# Patient Record
Sex: Female | Born: 1970
Health system: Southern US, Community
[De-identification: ages and names within clinical notes are randomized; demographics above are authoritative.]

## PROBLEM LIST (undated history)

## (undated) DIAGNOSIS — J45909 Unspecified asthma, uncomplicated: Secondary | ICD-10-CM

## (undated) DIAGNOSIS — E119 Type 2 diabetes mellitus without complications: Secondary | ICD-10-CM

---

## 2018-05-19 DIAGNOSIS — E1165 Type 2 diabetes mellitus with hyperglycemia: Secondary | ICD-10-CM | POA: Insufficient documentation

## 2018-05-19 DIAGNOSIS — Z79899 Other long term (current) drug therapy: Secondary | ICD-10-CM | POA: Insufficient documentation

## 2018-05-19 DIAGNOSIS — Z794 Long term (current) use of insulin: Secondary | ICD-10-CM | POA: Insufficient documentation

## 2018-05-19 DIAGNOSIS — J4552 Severe persistent asthma with status asthmaticus: Principal | ICD-10-CM | POA: Insufficient documentation

## 2018-05-19 DIAGNOSIS — D72829 Elevated white blood cell count, unspecified: Secondary | ICD-10-CM | POA: Insufficient documentation

## 2018-05-20 ENCOUNTER — Observation Stay (HOSPITAL_COMMUNITY)
Admission: EM | Admit: 2018-05-20 | Discharge: 2018-05-21 | Disposition: A | Discharge status: Home / Self Care | Payer: Self-pay | Attending: Internal Medicine | Admitting: Internal Medicine

## 2018-05-20 ENCOUNTER — Encounter (HOSPITAL_COMMUNITY): Payer: Self-pay | Admitting: Emergency Medicine

## 2018-05-20 ENCOUNTER — Emergency Department (HOSPITAL_COMMUNITY): Payer: Self-pay

## 2018-05-20 ENCOUNTER — Other Ambulatory Visit: Payer: Self-pay

## 2018-05-20 DIAGNOSIS — E669 Obesity, unspecified: Secondary | ICD-10-CM | POA: Diagnosis present

## 2018-05-20 DIAGNOSIS — J4552 Severe persistent asthma with status asthmaticus: Secondary | ICD-10-CM

## 2018-05-20 DIAGNOSIS — J45901 Unspecified asthma with (acute) exacerbation: Secondary | ICD-10-CM | POA: Diagnosis present

## 2018-05-20 DIAGNOSIS — R739 Hyperglycemia, unspecified: Secondary | ICD-10-CM | POA: Diagnosis present

## 2018-05-20 LAB — I-STAT CHEM 8, ED
BUN: 11 mg/dL (ref 6–20)
CHLORIDE: 103 mmol/L (ref 98–111)
Calcium, Ion: 1.18 mmol/L (ref 1.15–1.40)
Creatinine, Ser: 0.4 mg/dL — ABNORMAL LOW (ref 0.44–1.00)
Glucose, Bld: 492 mg/dL — ABNORMAL HIGH (ref 70–99)
HEMATOCRIT: 49 % — AB (ref 36.0–46.0)
Hemoglobin: 16.7 g/dL — ABNORMAL HIGH (ref 12.0–15.0)
Potassium: 3.2 mmol/L — ABNORMAL LOW (ref 3.5–5.1)
Sodium: 137 mmol/L (ref 135–145)
TCO2: 20 mmol/L — ABNORMAL LOW (ref 22–32)

## 2018-05-20 LAB — BASIC METABOLIC PANEL
Anion gap: 21 — ABNORMAL HIGH (ref 5–15)
BUN: 10 mg/dL (ref 6–20)
CALCIUM: 9.5 mg/dL (ref 8.9–10.3)
CO2: 14 mmol/L — ABNORMAL LOW (ref 22–32)
CREATININE: 0.74 mg/dL (ref 0.44–1.00)
Chloride: 103 mmol/L (ref 98–111)
GFR calc Af Amer: >60 mL/min (ref >60)
GFR calc non Af Amer: >60 mL/min (ref >60)
Glucose, Bld: 508 mg/dL (ref 70–99)
Potassium: 3.9 mmol/L (ref 3.5–5.1)
Sodium: 138 mmol/L (ref 135–145)

## 2018-05-20 LAB — CBC
HCT: 46.2 % — ABNORMAL HIGH (ref 36.0–46.0)
Hemoglobin: 15.5 g/dL — ABNORMAL HIGH (ref 12.0–15.0)
MCH: 30.6 pg (ref 26.0–34.0)
MCHC: 33.5 g/dL (ref 30.0–36.0)
MCV: 91.1 fL (ref 78.0–100.0)
PLATELETS: 186 10*3/uL (ref 150–400)
RBC: 5.07 MIL/uL (ref 3.87–5.11)
RDW: 12.6 % (ref 11.5–15.5)
WBC: 9.5 10*3/uL (ref 4.0–10.5)

## 2018-05-20 LAB — HEMOGLOBIN A1C
Hgb A1c MFr Bld: 12.4 % — ABNORMAL HIGH (ref 4.8–5.6)
Mean Plasma Glucose: 309.18 mg/dL

## 2018-05-20 LAB — CREATININE, SERUM
CREATININE: 0.79 mg/dL (ref 0.44–1.00)
GFR calc Af Amer: >60 mL/min (ref >60)
GFR calc non Af Amer: >60 mL/min (ref >60)

## 2018-05-20 LAB — I-STAT BETA HCG BLOOD, ED (MC, WL, AP ONLY): I-stat hCG, quantitative: <5 m[IU]/mL (ref <5)

## 2018-05-20 MED ORDER — PREDNISONE 20 MG PO TABS
60.0000 mg | ORAL_TABLET | Freq: Once | ORAL | Status: AC
  Administered 2018-05-20: 60 mg via ORAL
  Filled 2018-05-20: qty 3

## 2018-05-20 MED ORDER — INSULIN ASPART 100 UNIT/ML ~~LOC~~ SOLN
0.0000 [IU] | Freq: Three times a day (TID) | SUBCUTANEOUS | Status: DC
  Administered 2018-05-20: 15 [IU] via SUBCUTANEOUS
  Administered 2018-05-21: 11 [IU] via SUBCUTANEOUS
  Administered 2018-05-21: 7 [IU] via SUBCUTANEOUS
  Administered 2018-05-21: 11 [IU] via SUBCUTANEOUS

## 2018-05-20 MED ORDER — METHYLPREDNISOLONE SODIUM SUCC 125 MG IJ SOLR
60.0000 mg | Freq: Two times a day (BID) | INTRAMUSCULAR | Status: DC
  Administered 2018-05-20 – 2018-05-21 (×3): 60 mg via INTRAVENOUS
  Filled 2018-05-20 (×3): qty 2

## 2018-05-20 MED ORDER — IPRATROPIUM-ALBUTEROL 0.5-2.5 (3) MG/3ML IN SOLN
3.0000 mL | Freq: Four times a day (QID) | RESPIRATORY_TRACT | Status: DC
  Administered 2018-05-20 – 2018-05-21 (×6): 3 mL via RESPIRATORY_TRACT
  Filled 2018-05-20 (×5): qty 3

## 2018-05-20 MED ORDER — IPRATROPIUM BROMIDE 0.02 % IN SOLN
0.5000 mg | Freq: Once | RESPIRATORY_TRACT | Status: AC
  Administered 2018-05-20: 0.5 mg via RESPIRATORY_TRACT
  Filled 2018-05-20: qty 2.5

## 2018-05-20 MED ORDER — INSULIN ASPART 100 UNIT/ML ~~LOC~~ SOLN
10.0000 [IU] | Freq: Once | SUBCUTANEOUS | Status: AC
  Administered 2018-05-20: 10 [IU] via SUBCUTANEOUS

## 2018-05-20 MED ORDER — INSULIN ASPART PROT & ASPART (70-30 MIX) 100 UNIT/ML ~~LOC~~ SUSP
10.0000 [IU] | SUBCUTANEOUS | Status: AC
  Administered 2018-05-20: 10 [IU] via SUBCUTANEOUS

## 2018-05-20 MED ORDER — ENOXAPARIN SODIUM 40 MG/0.4ML ~~LOC~~ SOLN
40.0000 mg | SUBCUTANEOUS | Status: DC
  Administered 2018-05-20 – 2018-05-21 (×2): 40 mg via SUBCUTANEOUS
  Filled 2018-05-20 (×2): qty 0.4

## 2018-05-20 MED ORDER — INSULIN ASPART 100 UNIT/ML ~~LOC~~ SOLN
25.0000 [IU] | Freq: Once | SUBCUTANEOUS | Status: AC
  Administered 2018-05-20: 25 [IU] via SUBCUTANEOUS

## 2018-05-20 MED ORDER — LORATADINE 10 MG PO TABS
10.0000 mg | ORAL_TABLET | Freq: Every day | ORAL | Status: DC
  Administered 2018-05-20 – 2018-05-21 (×2): 10 mg via ORAL
  Filled 2018-05-20 (×2): qty 1

## 2018-05-20 MED ORDER — INSULIN ASPART 100 UNIT/ML ~~LOC~~ SOLN: 0.0000 [IU] | Freq: Every day | SUBCUTANEOUS | Status: DC

## 2018-05-20 MED ORDER — ALBUTEROL SULFATE (2.5 MG/3ML) 0.083% IN NEBU
5.0000 mg | INHALATION_SOLUTION | Freq: Once | RESPIRATORY_TRACT | Status: AC
  Administered 2018-05-20: 5 mg via RESPIRATORY_TRACT
  Filled 2018-05-20: qty 6

## 2018-05-20 MED ORDER — LIVING WELL WITH DIABETES BOOK - IN SPANISH
Freq: Once | Status: DC
  Filled 2018-05-20: qty 1

## 2018-05-20 MED ORDER — IPRATROPIUM-ALBUTEROL 0.5-2.5 (3) MG/3ML IN SOLN
3.0000 mL | RESPIRATORY_TRACT | Status: DC | PRN
  Filled 2018-05-20: qty 3

## 2018-05-20 MED ORDER — ALBUTEROL (5 MG/ML) CONTINUOUS INHALATION SOLN
10.0000 mg/h | INHALATION_SOLUTION | Freq: Once | RESPIRATORY_TRACT | Status: AC
  Administered 2018-05-20: 10 mg/h via RESPIRATORY_TRACT
  Filled 2018-05-20: qty 20

## 2018-05-20 MED ORDER — INSULIN ASPART PROT & ASPART (70-30 MIX) 100 UNIT/ML ~~LOC~~ SUSP: 24.0000 [IU] | Freq: Two times a day (BID) | SUBCUTANEOUS | Status: DC

## 2018-05-20 MED ORDER — INSULIN ASPART PROT & ASPART (70-30 MIX) 100 UNIT/ML ~~LOC~~ SUSP
15.0000 [IU] | Freq: Two times a day (BID) | SUBCUTANEOUS | Status: DC
  Administered 2018-05-20: 15 [IU] via SUBCUTANEOUS
  Filled 2018-05-20: qty 10

## 2018-05-20 MED ORDER — INSULIN ASPART 100 UNIT/ML ~~LOC~~ SOLN: 0.0000 [IU] | Freq: Three times a day (TID) | SUBCUTANEOUS | Status: DC

## 2018-05-20 MED ORDER — SODIUM CHLORIDE 0.9 % IV SOLN
INTRAVENOUS | Status: AC
  Administered 2018-05-20: 16:00:00 via INTRAVENOUS

## 2018-05-20 MED ORDER — INSULIN STARTER KIT- SYRINGES (ENGLISH)
1.0000 | Freq: Once | Status: DC
  Filled 2018-05-20: qty 1

## 2018-05-20 MED ORDER — SODIUM CHLORIDE 0.9 % IV SOLN
1.0000 g | INTRAVENOUS | Status: DC
  Administered 2018-05-20 – 2018-05-21 (×2): 1 g via INTRAVENOUS
  Filled 2018-05-20 (×3): qty 10

## 2018-05-20 MED ORDER — INSULIN ASPART PROT & ASPART (70-30 MIX) 100 UNIT/ML ~~LOC~~ SUSP
30.0000 [IU] | Freq: Two times a day (BID) | SUBCUTANEOUS | Status: DC
  Administered 2018-05-20 – 2018-05-21 (×3): 30 [IU] via SUBCUTANEOUS
  Filled 2018-05-20: qty 10

## 2018-05-20 NOTE — Progress Notes (Signed)
Started a continuous nebulizer treatment to deliver 10mg /hr of albuterol.

## 2018-05-20 NOTE — ED Provider Notes (Signed)
[facility] EMERGENCY DEPARTMENT Provider Note   CSN: [id number] Arrival date & time: 05/19/18  2355     History   Chief Complaint Chief Complaint  Patient presents with  . Shortness of Breath   Level 5 caveat due to acuity of condition HPI Margaret Santana is a 47 y.o. female.  The history is provided by the patient. The history is limited by the condition of the patient.  Shortness of Breath  This is a new problem. The problem occurs frequently.The problem has been rapidly worsening. Associated symptoms include cough and wheezing. Associated medical issues include asthma.   Patient presents with cough and wheezing and shortness of breath.  Patient noted to be in distress, therefore history limited.  PMH-Asthma Past Surgical History:  Procedure Laterality Date  . CESAREAN SECTION       OB History   None      Home Medications    Prior to Admission medications   Not on File    Family History No family history on file.  Social History Social History   Tobacco Use  . Smoking status: Never Smoker  . Smokeless tobacco: Never Used  Substance Use Topics  . Alcohol use: Never    Frequency: Never  . Drug use: Never     Allergies   Patient has no allergy information on record.   Review of Systems Review of Systems  Unable to perform ROS: Acuity of condition  Respiratory: Positive for cough, shortness of breath and wheezing.      Physical Exam Updated Vital Signs BP (!) 147/83   Pulse (!) 117   Temp 98.5 F (36.9 C) (Oral)   Resp (!) 26   LMP 05/11/2018   SpO2 94%   Physical Exam CONSTITUTIONAL: Well developed/well nourished, distress noted HEAD: Normocephalic/atraumatic EYES: EOMI/PERRL ENMT: Mucous membranes moist NECK: supple no meningeal signs SPINE/BACK:entire spine nontender CV: S1/S2 noted, no murmurs/rubs/gallops noted LUNGS: Wheezing bilaterally, distress noted ABDOMEN: soft, nontender, no rebound or  guarding, bowel sounds noted throughout abdomen GU:no cva tenderness NEURO: Pt is awake/alert/appropriate, moves all extremitiesx4.  No facial droop.   EXTREMITIES: pulses normal/equal, full ROM, no lower extremity edema SKIN: warm, color normal PSYCH: no abnormalities of mood noted, alert and oriented to situation  ED Treatments / Results  Labs (all labs ordered are listed, but only abnormal results are displayed) Labs Reviewed  I-STAT CHEM 8, ED - Abnormal; Notable for the following components:      Result Value   Potassium 3.2 (*)    Creatinine, Ser 0.40 (*)    Glucose, Bld 492 (*)    TCO2 20 (*)    Hemoglobin 16.7 (*)    HCT 49.0 (*)    All other components within normal limits  I-STAT BETA HCG BLOOD, ED ([facility], [facility], [facility] ONLY)    EKG EKG Interpretation  Date/Time:  Monday May 20 2018 00:02:03 EDT Ventricular Rate:  120 PR Interval:  144 QRS Duration: 88 QT Interval:  322 QTC Calculation: 455 R Axis:   0 Text Interpretation:  Sinus tachycardia Septal infarct , age undetermined Abnormal ECG No previous ECGs available Confirmed by Zadie Rhine ([id number]) on 05/20/2018 12:56:00 AM   Radiology Dg Chest 2 View  Result Date: 05/20/2018 CLINICAL DATA:  47 year old female with shortness of breath. EXAM: CHEST - 2 VIEW COMPARISON:  None. FINDINGS: The heart size and mediastinal contours are within normal limits. Both lungs are clear. The visualized skeletal structures are unremarkable. IMPRESSION: No active  cardiopulmonary disease. Electronically Signed   By: Elgie Collard M.D.   On: 05/20/2018 00:41    Procedures Procedures  CRITICAL CARE Performed by: Joya Gaskins Total critical care time: 45 minutes Critical care time was exclusive of separately billable procedures and treating other patients. Critical care was necessary to treat or prevent imminent or life-threatening deterioration. Critical care was time spent personally by me on the following activities:  development of treatment plan with patient and/or surrogate as well as nursing, discussions with consultants, evaluation of patient's response to treatment, examination of patient, obtaining history from patient or surrogate, ordering and performing treatments and interventions, ordering and review of laboratory studies, ordering and review of radiographic studies, pulse oximetry and re-evaluation of patient's condition. Patient with status asthmaticus requiring multiple nebulized treatments and admission.  Medications Ordered in ED Medications  albuterol (PROVENTIL) (2.5 MG/3ML) 0.083% nebulizer solution 5 mg (5 mg Nebulization Given 05/20/18 0029)  predniSONE (DELTASONE) tablet 60 mg (60 mg Oral Given 05/20/18 0046)  albuterol (PROVENTIL) (2.5 MG/3ML) 0.083% nebulizer solution 5 mg (5 mg Nebulization Given 05/20/18 0039)  ipratropium (ATROVENT) nebulizer solution 0.5 mg (0.5 mg Nebulization Given 05/20/18 0039)  albuterol (PROVENTIL,VENTOLIN) solution continuous neb (10 mg/hr Nebulization Given 05/20/18 0142)  albuterol (PROVENTIL) (2.5 MG/3ML) 0.083% nebulizer solution 5 mg (5 mg Nebulization Given 05/20/18 0426)     Initial Impression / Assessment and Plan / ED Course  I have reviewed the triage vital signs and the nursing notes.  Pertinent labs & imaging results that were available during my care of the patient were reviewed by me and considered in my medical decision making (see chart for details).     4:57 AM Patient was seen soon after arrival to room for obvious asthma exacerbation.  She was tachycardic, tachypneic and mildly hypoxic.  She was aggressively treated with nebulized therapies including albuterol and Atrovent.  She was also given steroids as well. Chest x-ray was negative for pneumonia or CHF. ED revealed sinus tachycardia. despite multiple nebulized therapies, patient is still wheezing and tachypneic even in bed. I was able to arrange an interpreter to interview her after her  treatments. Interpreter [phone] Patient tells me she only has allergies.  Does not seem to know she has asthma, and labs revealed probable diabetes that appears to be untreated She is a non-smoker.  She did recently travel to [place], but denies any chest pain or fevers.  No signs of infectious etiology, low suspicion for PE at this time.  Discussed with Dr. Jomarie Longs for admission. Final Clinical Impressions(s) / ED Diagnoses   Final diagnoses:  Severe persistent asthma with status asthmaticus    ED Discharge Orders    None       Zadie Rhine, MD 05/20/18 0500

## 2018-05-20 NOTE — Progress Notes (Signed)
PROGRESS NOTE                                                                                                                                                                                                             Patient Demographics:    Margaret Santana, is a 47 y.o. female, DOB - [birth year], KJI:[id number]  Admit date - 05/20/2018   Admitting Physician Domenic Polite, MD  Outpatient Primary MD for the patient is Patient, No Pcp Per  LOS - 0   Chief Complaint  Patient presents with  . Shortness of Breath       Brief Narrative    This is a no charge note as patient admitted earlier today    Subjective:    Margaret Santana today has, No headache, No chest pain, No abdominal pain -still having some shortness of breath  Assessment  & Plan :    Active Problems:   Asthma exacerbation   Hyperglycemia   Obesity  Acute Asthma exacerbation -Still have some significant wheezing this morning, continue with IV Solu-Medrol and Rocephin, hopefully can be transitioned to p.o. prednisone in a.m.  -Chest x-ray is unremarkable -Flutter valve -Case management consult, she will need a PCP    Hyperglycemia -No history of diabetes however she's not had very good medical care previously, CBG significantly elevated at 492 on admission, hemoglobin A1c came back significantly elevated at 12.4, she was started on insulin 70/30 on admission, 15 units twice daily, I have increased it to 25 units twice daily, as well increased her sliding scale to resistant . -Diabetes coordinator consult -needs PCP    Obesity -Needs/some modification     Code Status : full code  Family Communication  : husband at bedside  Disposition Plan  : home when stable  Consults  :  none  Procedures  : None  DVT Prophylaxis  :  [place] lovenox  Lab Results  Component Value Date   PLT 186 05/20/2018    Antibiotics  :    Anti-infectives (From admission,  onward)   Start     Dose/Rate Route Frequency Ordered Stop   05/20/18 0900  cefTRIAXone (ROCEPHIN) 1 g in sodium chloride 0.9 % 100 mL IVPB     1 g 200 mL/hr over 30 Minutes Intravenous Every 24 hours 05/20/18 0801  Objective:   Vitals:   05/20/18 0638 05/20/18 0924 05/20/18 1042 05/20/18 1432  BP: 112/69 125/75    Pulse: (!) 122 (!) 117    Resp: 16 20    Temp: 98.2 F (36.8 C) 97.7 F (36.5 C)    TempSrc: Oral Oral    SpO2: 92% 93% 96% 96%    Wt Readings from Last 3 Encounters:  No data found for Wt     Intake/Output Summary (Last 24 hours) at 05/20/2018 1517 Last data filed at 05/20/2018 1125 Gross per 24 hour  Intake 120 ml  Output 650 ml  Net -530 ml     Physical Exam  Awake Alert, Oriented X 3, No new F.N deficits, Normal affect  Symmetrical Chest wall movement, diminished air entry bilaterally, with scattered wheezing RRR,No Gallops,Rubs or new Murmurs, No Parasternal Heave +ve B.Sounds, Abd Soft, No tenderness, No rebound - guarding or rigidity. No Cyanosis, Clubbing or edema, No new Rash or bruise      Data Review:    CBC Recent Labs  Lab 05/20/18 0407 05/20/18 0852  WBC  --  9.5  HGB 16.7* 15.5*  HCT 49.0* 46.2*  PLT  --  186  MCV  --  91.1  MCH  --  30.6  MCHC  --  33.5  RDW  --  12.6    Chemistries  Recent Labs  Lab 05/20/18 0407 05/20/18 0852  NA 137  --   K 3.2*  --   CL 103  --   GLUCOSE 492*  --   BUN 11  --   CREATININE 0.40* 0.79   ------------------------------------------------------------------------------------------------------------------ No results for input(s): CHOL, HDL, LDLCALC, TRIG, CHOLHDL, LDLDIRECT in the last 72 hours.  Lab Results  Component Value Date   HGBA1C 12.4 (H) 05/20/2018   ------------------------------------------------------------------------------------------------------------------ No results for input(s): TSH, T4TOTAL, T3FREE, THYROIDAB in the last 72 hours.  Invalid input(s):  FREET3 ------------------------------------------------------------------------------------------------------------------ No results for input(s): VITAMINB12, FOLATE, FERRITIN, TIBC, IRON, RETICCTPCT in the last 72 hours.  Coagulation profile No results for input(s): INR, PROTIME in the last 168 hours.  No results for input(s): DDIMER in the last 72 hours.  Cardiac Enzymes No results for input(s): CKMB, TROPONINI, MYOGLOBIN in the last 168 hours.  Invalid input(s): CK ------------------------------------------------------------------------------------------------------------------ No results found for: BNP  Inpatient Medications  Scheduled Meds: . enoxaparin (LOVENOX) injection  40 mg Subcutaneous Q24H  . insulin aspart  0-20 Units Subcutaneous TID WC  . insulin aspart  0-5 Units Subcutaneous QHS  . insulin aspart protamine- aspart  24 Units Subcutaneous BID WC  . insulin starter kit- syringes  1 kit Other Once  . ipratropium-albuterol  3 mL Nebulization Q6H  . living well with diabetes book- in spanish   Does not apply Once  . loratadine  10 mg Oral Daily  . methylPREDNISolone (SOLU-MEDROL) injection  60 mg Intravenous Q12H   Continuous Infusions: . cefTRIAXone (ROCEPHIN)  IV 1 g (05/20/18 1023)   PRN Meds:.ipratropium-albuterol  Micro Results No results found for this or any previous visit (from the past 240 hour(s)).  Radiology Reports Dg Chest 2 View  Result Date: 05/20/2018 CLINICAL DATA:  47 year old female with shortness of breath. EXAM: CHEST - 2 VIEW COMPARISON:  None. FINDINGS: The heart size and mediastinal contours are within normal limits. Both lungs are clear. The visualized skeletal structures are unremarkable. IMPRESSION: No active cardiopulmonary disease. Electronically Signed   By: Anner Crete M.D.   On: 05/20/2018 00:41     Deepak Bless  Ermagene Saidi M.D on 05/20/2018 at 3:17 PM  Between 7am to 7pm - Pager - [phone]  After 7pm go to [email or web address] -  password [facility]  [facility] -  Office  [phone]

## 2018-05-20 NOTE — H&P (Signed)
History and Physical    Margaret Santana XNA:[id number] DOB: January 04, 1971 DOA: 05/20/2018  Referring MD/NP/PA: EDP PCP:  Patient coming from: home  Chief Complaint: shortness of breath and wheezing  HPI: Margaret Santana is a 47 y.o. female from [place] with history of ? Allergies on a regimen of salmeterol, theophylline and Claritin, suspect she was most likely being treated for asthma here, presents to the emergency room with increasing shortness of breath, wheezing and cough x2days. -She denies any chest pain, swelling in her legs ED Course: noted to have diffuse wheezing with poor air movement and respiratory distress initially, improving after hour-long albuterol neb  Review of Systems: As per HPI otherwise 14 point review of systems negative.   History reviewed. No pertinent past medical history.  Past Surgical History:  Procedure Laterality Date  . CESAREAN SECTION       reports that she has never smoked. She has never used smokeless tobacco. She reports that she does not drink alcohol or use drugs.  No Known Allergies  No family history on file.   Prior to Admission medications   Medication Sig Start Date End Date Taking? Authorizing Provider  albuterol (PROVENTIL HFA;VENTOLIN HFA) 108 (90 Base) MCG/ACT inhaler Inhale 1-2 puffs into the lungs every 6 (six) hours as needed for wheezing or shortness of breath.   Yes [provider]  loratadine (CLARITIN) 10 MG tablet Take 10 mg by mouth daily.   Yes [provider]  Theophylline (THEO-24 PO) Take 1 tablet by mouth 2 (two) times daily.   Yes [provider]    Physical Exam: Vitals:   05/20/18 0334 05/20/18 0427 05/20/18 0445 05/20/18 0500  BP: 108/74  (!) 142/76 (!) 121/109  Pulse: (!) 128  (!) 128 (!) 127  Resp: 20  (!) 24 20  Temp:      TempSrc:      SpO2: 92% 91% 95% 94%      Constitutional: obese female, laying in bed, no distress Vitals:   05/20/18 0334 05/20/18 0427  05/20/18 0445 05/20/18 0500  BP: 108/74  (!) 142/76 (!) 121/109  Pulse: (!) 128  (!) 128 (!) 127  Resp: 20  (!) 24 20  Temp:      TempSrc:      SpO2: 92% 91% 95% 94%   Eyes: PERRL, lids and conjunctivae normal ENMT: Mucous membranes are moist.  Neck: normal, supple Respiratory: poor air movement bilaterally, diffuse scattered expiratory wheezes Cardiovascular: S1-S2/tachycardic Abdomen: soft, non tender, Bowel sounds positive.  Musculoskeletal: No joint deformity upper and lower extremities. Ext: no edema Skin: no rashes, lesions, ulcers.  Neurologic: CN 2-12 grossly intact. Sensation intact, DTR normal. Strength 5/5 in all 4.  Psychiatric: Normal judgment and insight. Alert and oriented x 3. Normal mood.   Labs on Admission: I have personally reviewed following labs and imaging studies  CBC: Recent Labs  Lab 05/20/18 0407  HGB 16.7*  HCT 54.2*   Basic Metabolic Panel: Recent Labs  Lab 05/20/18 0407  NA 137  K 3.2*  CL 103  GLUCOSE 492*  BUN 11  CREATININE 0.40*   GFR: CrCl cannot be calculated (Unknown ideal weight.). Liver Function Tests: No results for input(s): AST, ALT, ALKPHOS, BILITOT, PROT, ALBUMIN in the last 168 hours. No results for input(s): LIPASE, AMYLASE in the last 168 hours. No results for input(s): AMMONIA in the last 168 hours. Coagulation Profile: No results for input(s): INR, PROTIME in the last 168 hours. Cardiac Enzymes: No results  for input(s): CKTOTAL, CKMB, CKMBINDEX, TROPONINI in the last 168 hours. BNP (last 3 results) No results for input(s): PROBNP in the last 8760 hours. HbA1C: No results for input(s): HGBA1C in the last 72 hours. CBG: No results for input(s): GLUCAP in the last 168 hours. Lipid Profile: No results for input(s): CHOL, HDL, LDLCALC, TRIG, CHOLHDL, LDLDIRECT in the last 72 hours. Thyroid Function Tests: No results for input(s): TSH, T4TOTAL, FREET4, T3FREE, THYROIDAB in the last 72 hours. Anemia Panel: No  results for input(s): VITAMINB12, FOLATE, FERRITIN, TIBC, IRON, RETICCTPCT in the last 72 hours. Urine analysis: No results found for: COLORURINE, APPEARANCEUR, LABSPEC, PHURINE, GLUCOSEU, HGBUR, BILIRUBINUR, KETONESUR, PROTEINUR, UROBILINOGEN, NITRITE, LEUKOCYTESUR Sepsis Labs: '@LABRCNTIP' (procalcitonin:4,lacticidven:4) )No results found for this or any previous visit (from the past 240 hour(s)).   Radiological Exams on Admission: Dg Chest 2 View  Result Date: 05/20/2018 CLINICAL DATA:  47 year old female with shortness of breath. EXAM: CHEST - 2 VIEW COMPARISON:  None. FINDINGS: The heart size and mediastinal contours are within normal limits. Both lungs are clear. The visualized skeletal structures are unremarkable. IMPRESSION: No active cardiopulmonary disease. Electronically Signed   By: Anner Crete M.D.   On: 05/20/2018 00:41    EKG: sinus tachycardia, no acute ST-T wave changes  Assessment/Plan    Acute Asthma exacerbation -starting to improve, continue duo nebs every 6 hours, IV Solu-Medrol and add ceftriaxone -Chest x-ray is unremarkable -Flutter valve -Case management consult, she will need a PCP    Hyperglycemia -No history of diabetes however she's not had very good medical care previously, blood glucose on B met was 492, likely suggesting new onset diabetes, will confirm with hemoglobin A1c -Start insulin 70/30, SSI -Diabetes coordinator consult -needs PCP    Obesity -Needs/some modification  DVT prophylaxis: lovenox Code Status: FUll Code Family Communication: spouse at bedside Disposition Plan: home pending improvement Consults called: none Admission status: observation  Domenic Polite MD [facility] Pager [phone]  If 7PM-7AM, please contact night-coverage [email or web address] Password [facility]  05/20/2018, 5:12 AM

## 2018-05-20 NOTE — ED Triage Notes (Signed)
Pt from home has h/s of asthma. Used inhaler this afternoon. Pt denies chest pain and RR 30. Pt using accessory muscles to breathe. Inspiratory and expiratory wheezes throughout

## 2018-05-21 DIAGNOSIS — J45901 Unspecified asthma with (acute) exacerbation: Secondary | ICD-10-CM

## 2018-05-21 DIAGNOSIS — E1165 Type 2 diabetes mellitus with hyperglycemia: Secondary | ICD-10-CM

## 2018-05-21 LAB — BASIC METABOLIC PANEL
ANION GAP: 6 (ref 5–15)
BUN: 19 mg/dL (ref 6–20)
CALCIUM: 9.3 mg/dL (ref 8.9–10.3)
CO2: 24 mmol/L (ref 22–32)
Chloride: 108 mmol/L (ref 98–111)
Creatinine, Ser: 0.49 mg/dL (ref 0.44–1.00)
GLUCOSE: 189 mg/dL — AB (ref 70–99)
POTASSIUM: 4.3 mmol/L (ref 3.5–5.1)
Sodium: 138 mmol/L (ref 135–145)

## 2018-05-21 LAB — GLUCOSE, CAPILLARY
GLUCOSE-CAPILLARY: 243 mg/dL — AB (ref 70–99)
Glucose-Capillary: 266 mg/dL — ABNORMAL HIGH (ref 70–99)
Glucose-Capillary: 278 mg/dL — ABNORMAL HIGH (ref 70–99)

## 2018-05-21 LAB — CBC
HEMATOCRIT: 46.1 % — AB (ref 36.0–46.0)
Hemoglobin: 15.7 g/dL — ABNORMAL HIGH (ref 12.0–15.0)
MCH: 30.8 pg (ref 26.0–34.0)
MCHC: 34.1 g/dL (ref 30.0–36.0)
MCV: 90.6 fL (ref 78.0–100.0)
PLATELETS: 182 10*3/uL (ref 150–400)
RBC: 5.09 MIL/uL (ref 3.87–5.11)
RDW: 12.7 % (ref 11.5–15.5)
WBC: 20.7 10*3/uL — AB (ref 4.0–10.5)

## 2018-05-21 LAB — HIV ANTIBODY (ROUTINE TESTING W REFLEX): HIV Screen 4th Generation wRfx: NONREACTIVE

## 2018-05-21 MED ORDER — METFORMIN HCL 500 MG PO TABS: 500.0000 mg | ORAL_TABLET | Freq: Two times a day (BID) | ORAL | 0 refills | Status: AC

## 2018-05-21 MED ORDER — "PEN NEEDLES 3/16"" 31G X 5 MM MISC": 3 refills | Status: DC

## 2018-05-21 MED ORDER — INSULIN ISOPHANE & REGULAR (HUMAN 70-30)100 UNIT/ML KWIKPEN: 30.0000 [IU] | PEN_INJECTOR | Freq: Two times a day (BID) | SUBCUTANEOUS | 4 refills | Status: DC

## 2018-05-21 MED ORDER — PREDNISONE 10 MG PO TABS: ORAL_TABLET | ORAL | 0 refills | Status: DC

## 2018-05-21 MED ORDER — METFORMIN HCL 500 MG PO TABS
500.0000 mg | ORAL_TABLET | Freq: Two times a day (BID) | ORAL | Status: DC
  Administered 2018-05-21: 500 mg via ORAL
  Filled 2018-05-21: qty 1

## 2018-05-21 MED ORDER — FREESTYLE LANCETS MISC: 12 refills | Status: DC

## 2018-05-21 MED ORDER — METFORMIN HCL 500 MG PO TABS: 500.0000 mg | ORAL_TABLET | Freq: Two times a day (BID) | ORAL | 0 refills | Status: DC

## 2018-05-21 NOTE — Progress Notes (Signed)
Inpatient Diabetes Program Recommendations  AACE/ADA: New Consensus Statement on Inpatient Glycemic Control (2015)  Target Ranges:  Prepandial:   less than 140 mg/dL      Peak postprandial:   less than 180 mg/dL (1-2 hours)      Critically ill patients:  140 - 180 mg/dL   Lab Results  Component Value Date   GLUCAP 278 (H) 05/21/2018   HGBA1C 12.4 (H) 05/20/2018    Review of Glycemic Control Results for Margaret Santana, Margaret Santana (MRN [id number]) as of 05/21/2018 16:45  Ref. Range 05/21/2018 07:37 05/21/2018 11:44  Glucose-Capillary Latest Ref Range: 70 - 99 mg/dL 266 (H) 278 (H)   Diabetes history: new onset Outpatient Diabetes medications: none Current orders for Inpatient glycemic control: Novolog 0-20 units TID, Novolog 0-5 units QHS, Novolog 70/30 30 units BID Solumedrol 60 mg Q12H  Inpatient Diabetes Program Recommendations:    Met with patient using interpreters regarding new onset diabetes.   Reviewed patient's current A1c of 12.4%. Explained what a A1c is and what it measures. Also reviewed goal A1c with patient, importance of good glucose control @ home, and blood sugar goals. Reviewed patho of DM, need for insulin, role of pancreas, comorbidites associated from poor control and vascular changes. Dicussed differences between intermediate acting and short acting insulin and the need to eat with 70/30 insulin. Lastly, reviewed survival skills, sick days, when to contact a MD and interventions associated. Patient was able to verbalize.   Patient will need a meter. Encouraged to check BS 3-4 times per day and to write them down and take with her to her next appointment. Briefly discussed diet and the importance of eliminating sugary beverages and foods that are higher in carbohydrates. Reviewed total carbohydrates in LWWDM and reiterated carb allowances per dietitian. Educated patient on increasing activity level and the impact this has on blood sugars.   Per MD patient is scheduled to be  seen in the [facility]. Discussed plan of care and patient is able to purchase all supplies at [facility] to include: meter, lancets, strips, insulin pens, pen needles and alcohol swabs. This information was provided to patient for purchase and patient states she is able to afford. Reviewed Novolin 70/30 with pateint and confirmed dosage of 30 units BID at discharge with patient.  Educated patient and spouse on insulin pen use at home. Reviewed contents of insulin flexpen starter kit. Reviewed all steps if insulin pen including attachment of needle, 2-unit air shot, dialing up dose, giving injection, removing needle, disposal of sharps, storage of unused insulin, disposal of insulin etc. Patient able to provide successful return demonstration. Also reviewed troubleshooting with insulin pen. MD to give patient Rxs for insulin pens and insulin pen needles. At this time, patient has no further questions.   Thanks, Bronson Curb, MSN, RNC-OB Diabetes Coordinator [phone] (8a-5p)

## 2018-05-21 NOTE — Discharge Instructions (Signed)
[facility]  Get CBC, CMP, checked  by Primary MD next visit.    Activity: As tolerated with Full fall precautions use walker/cane & assistance as needed   Disposition Home    Diet: Carbohydrate modified diet   On your next visit with your primary care physician please Get Medicines reviewed and adjusted.   Please request your Prim.MD to go over all Hospital Tests and Procedure/Radiological results at the follow up, please get all Hospital records sent to your Prim MD by signing hospital release before you go home.   If you experience worsening of your admission symptoms, develop shortness of breath, life threatening emergency, suicidal or homicidal thoughts you must seek medical attention immediately by calling 911 or calling your MD immediately  if symptoms less severe.  You Must read complete instructions/literature along with all the possible adverse reactions/side effects for all the Medicines you take and that have been prescribed to you. Take any new Medicines after you have completely understood and accpet all the possible adverse reactions/side effects.   Do not drive, operating heavy machinery, perform activities at heights, swimming or participation in water activities or provide baby sitting services if your were admitted for syncope or siezures until you have seen by Primary MD or a Neurologist and advised to do so again.  Do not drive when taking Pain medications.    Do not take more than prescribed Pain, Sleep and Anxiety Medications  Special Instructions: If you have smoked or chewed Tobacco  in the last 2 yrs please stop smoking, stop any regular Alcohol  and or any Recreational drug use.  Wear Seat belts while driving.   Please note  You were cared for by a hospitalist during your hospital stay. If you have any questions about your discharge medications or the care you received while you were in the hospital after you are discharged, you  can call the unit and asked to speak with the hospitalist on call if the hospitalist that took care of you is not available. Once you are discharged, your primary care physician will handle any further medical issues. Please note that NO REFILLS for any discharge medications will be authorized once you are discharged, as it is imperative that you return to your primary care physician (or establish a relationship with a primary care physician if you do not have one) for your aftercare needs so that they can reassess your need for medications and monitor your lab values.

## 2018-05-21 NOTE — Care Management Note (Signed)
Case Management Note  Patient Details  Name: Margaret Santana MRN: [id number] Date of Birth: 1971-03-01  Subjective/Objective:    Pt admitted with SOB                Action/Plan:   Assessment facilitated via interpretor.  PTA independent from home with husband - pt denied barriers with returning home.  Pt interested in [facility] appt - appt made for 9/11 at 8:50am.  Pt informed CM that she has supply of PTA meds to get her to appt 9/11 - CM explained that pt can ask for medication assistance during first appt.     Expected Discharge Date:                  Expected Discharge Plan:  Home/Self Care  In-House Referral:     Discharge planning Services  CM Consult, [facility]  Post Acute Care Choice:    Choice offered to:     DME Arranged:    DME Agency:     HH Arranged:    HH Agency:     Status of Service:  In process, will continue to follow  If discussed at Long Length of Stay Meetings, dates discussed:    Additional Comments:  Cherylann Parr, RN 05/21/2018, 10:34 AM

## 2018-05-21 NOTE — Progress Notes (Signed)
Patient discharged to home. Patient AVS reviewed and signed. Patient capable re-verbalizing medications and follow-up appointments. IV removed. Patient belongings sent with patient. Patient educated to return to the ED in the event of SOB, chest pain or dizziness.   Ren Aspinall B. RN 

## 2018-05-21 NOTE — Plan of Care (Signed)
  RD consulted for nutrition education regarding diabetes. New onset  Lab Results  Component Value Date   HGBA1C 12.4 (H) 05/20/2018    RD provided "Carbohydrate Counting for People with Diabetes" handout from the Academy of Nutrition and Dietetics in Spanish. [employer] Interpretor services utilized. Spouse present.  Discussed different food groups and their effects on blood sugar, emphasizing carbohydrate-containing foods. Provided list of carbohydrates and recommended serving sizes of common foods.  Discussed importance of controlled and consistent carbohydrate intake throughout the day. Pt reports currently she does skip meals; discussed new goal of eating when she wakes up Pt currently drinks water and some soda. Discouraged intake of surgary drinks such as regular soda, juices, etc. Encouraged sugar-free beverages. Provided examples of ways to balance meals/snacks and encouraged intake of high-fiber, whole grain complex carbohydrates. All questions answered. Teach back method used.  Expect good compliance.  Current diet order is Carb Modified, patient reports good appetite with no weight loss. Labs and medications reviewed. No further nutrition interventions warranted at this time. RD contact information provided. If additional nutrition issues arise, please re-consult RD.  Margaret Starcher MS, RD, LDN, CNSC [phone] Pager  [phone] Weekend/On-Call Pager

## 2018-05-21 NOTE — Discharge Summary (Signed)
Margaret Santana, is a 47 y.o. female  DOB 20-Jul-1971  MRN [id number].  Admission date:  05/20/2018  Admitting Physician  Zannie Cove, MD  Discharge Date:  05/21/2018   Primary MD  Patient, No Pcp Per  Recommendations for primary care physician for things to follow:  -Check CBC, BMP during next visit -Continue adjustment of hypoglycemic agents  Admission Diagnosis  Severe persistent asthma with status asthmaticus [J45.52]   Discharge Diagnosis  Severe persistent asthma with status asthmaticus [J45.52]    Active Problems:   Asthma exacerbation   Hyperglycemia   Obesity      History reviewed. No pertinent past medical history.  Past Surgical History:  Procedure Laterality Date  . CESAREAN SECTION         History of present illness and  Hospital Course:     Kindly see H&P for history of present illness and admission details, please review complete Labs, Consult reports and Test reports for all details in brief  HPI  from the history and physical done on the day of admission  HPI: Margaret Santana is a 47 y.o. female from [place] with history of ? Allergies on a regimen of salmeterol, theophylline and Claritin, suspect she was most likely being treated for asthma here, presents to the emergency room with increasing shortness of breath, wheezing and cough x2days. -She denies any chest pain, swelling in her legs ED Course: noted to have diffuse wheezing with poor air movement and respiratory distress initially, improving after hour-long albuterol neb   Hospital Course   AcuteAsthma exacerbation -Patient reports history of asthma in the past, but never intubated, she presents with significant wheezing and increased work of breathing, she was started on IV Solu-Medrol, she had significant improvement today, giving her productive cough she was started on Rocephin as well, this  morning dyspnea has improved, wheezing has significantly improved as well, she will be discharged on prednisone taper, no indication for antibiotics on discharge, her chest x-ray was unremarkable . -Leukocytosis most likely in the setting of steroids use, he is nontoxic-appearing, afebrile  Diabetes mellitus, type II ,uncontrolled -No history of diabetes however she's not had very good medical care previously, CBG significantly elevated at 492 on admission, hemoglobin A1c came back significantly elevated at 12.4, she was started on Humulin 70/30 on admission, overall she has acceptable control with 30 units twice daily, metformin 500 mg oral twice daily, her CBG may slightly elevated, but we would tolerate that giving this is a new diagnosis, and she is currently on prednisone, and I do suspect it will improve after she stops her steroids . -She was seen by the diabetic coordinator, and the nutritionist as well .  Obesity -Nutrition consult appreciated  Follow-up appointment has been arranged with [facility]        Discharge Condition:  stable   Follow UP  Follow-up Information    [facility] Follow up.   Why:  appointment on 05/29/2018 at 8:50 am Contact  information: [address] [place] [address] [phone]            Discharge Instructions  and  Discharge Medications   Discharge Instructions    Discharge instructions   Complete by:  As directed    [facility]  Get CBC, CMP, checked  by Primary MD next visit.    Activity: As tolerated with Full fall precautions use walker/cane & assistance as needed   Disposition Home    Diet: Carbohydrate modified diet   On your next visit with your primary care physician please Get Medicines reviewed and adjusted.   Please request your Prim.MD to go over all Hospital Tests and Procedure/Radiological results at the follow  up, please get all Hospital records sent to your Prim MD by signing hospital release before you go home.   If you experience worsening of your admission symptoms, develop shortness of breath, life threatening emergency, suicidal or homicidal thoughts you must seek medical attention immediately by calling 911 or calling your MD immediately  if symptoms less severe.  You Must read complete instructions/literature along with all the possible adverse reactions/side effects for all the Medicines you take and that have been prescribed to you. Take any new Medicines after you have completely understood and accpet all the possible adverse reactions/side effects.   Do not drive, operating heavy machinery, perform activities at heights, swimming or participation in water activities or provide baby sitting services if your were admitted for syncope or siezures until you have seen by Primary MD or a Neurologist and advised to do so again.  Do not drive when taking Pain medications.    Do not take more than prescribed Pain, Sleep and Anxiety Medications  Special Instructions: If you have smoked or chewed Tobacco  in the last 2 yrs please stop smoking, stop any regular Alcohol  and or any Recreational drug use.  Wear Seat belts while driving.   Please note  You were cared for by a hospitalist during your hospital stay. If you have any questions about your discharge medications or the care you received while you were in the hospital after you are discharged, you can call the unit and asked to speak with the hospitalist on call if the hospitalist that took care of you is not available. Once you are discharged, your primary care physician will handle any further medical issues. Please note that NO REFILLS for any discharge medications will be authorized once you are discharged, as it is imperative that you return to your primary care physician (or establish a relationship with a primary care physician if you do  not have one) for your aftercare needs so that they can reassess your need for medications and monitor your lab values.   Increase activity slowly   Complete by:  As directed      Allergies as of 05/21/2018   No Known Allergies     Medication List    TAKE these medications   albuterol 108 (90 Base) MCG/ACT inhaler Commonly known as:  PROVENTIL HFA;VENTOLIN HFA Inhale 1-2 puffs into the lungs every 6 (six) hours as needed for wheezing or shortness of breath.   freestyle lancets Use as instructed   Insulin Isophane & Regular Human (70-30) 100 UNIT/ML PEN Commonly known as:  HUMULIN 70/30 MIX Inject 30 Units into the skin 2 (two) times daily before a meal.   loratadine 10 MG tablet Commonly known as:  CLARITIN Take 10 mg by mouth  daily.   metFORMIN 500 MG tablet Commonly known as:  GLUCOPHAGE Take 1 tablet (500 mg total) by mouth 2 (two) times daily with a meal.   Pen Needles 3/16" 31G X 5 MM Misc Use with insulin   predniSONE 10 MG tablet Commonly known as:  DELTASONE Take 40 mg oral for 2 days, then 30 mg oral for 2 days, then 20 mg oral for 2 days, then 10 mg oral for 2 days.   THEO-24 PO Take 1 tablet by mouth 2 (two) times daily.         Diet and Activity recommendation: See Discharge Instructions above   Consults obtained -  none  Major procedures and Radiology Reports - PLEASE review detailed and final reports for all details, in brief -      Dg Chest 2 View  Result Date: 05/20/2018 CLINICAL DATA:  47 year old female with shortness of breath. EXAM: CHEST - 2 VIEW COMPARISON:  None. FINDINGS: The heart size and mediastinal contours are within normal limits. Both lungs are clear. The visualized skeletal structures are unremarkable. IMPRESSION: No active cardiopulmonary disease. Electronically Signed   By: Elgie Collard M.D.   On: 05/20/2018 00:41    Micro Results     No results found for this or any previous visit (from the past 240  hour(s)).     Today   Subjective:   Montrice Montuori today has no headache,no chest abdominal pain,no new weakness tingling or numbness, feels much better wants to go home today.   Objective:   Blood pressure 113/79, pulse 79, temperature 98.1 F (36.7 C), temperature source Oral, resp. rate 18, last menstrual period 05/11/2018, SpO2 94 %.   Intake/Output Summary (Last 24 hours) at 05/21/2018 1711 Last data filed at 05/21/2018 0900 Gross per 24 hour  Intake 662.87 ml  Output 575 ml  Net 87.87 ml    Exam Awake Alert, Oriented x 3, No new F.N deficits, Normal affect  Symmetrical Chest wall movement, Good air movement bilaterally, scattered wheezing RRR,No Gallops,Rubs or new Murmurs, No Parasternal Heave +ve B.Sounds, Abd Soft, Non tender,  No rebound -guarding or rigidity. No Cyanosis, Clubbing or edema, No new Rash or bruise  Data Review   CBC w Diff:  Lab Results  Component Value Date   WBC 20.7 (H) 05/21/2018   HGB 15.7 (H) 05/21/2018   HCT 46.1 (H) 05/21/2018   PLT 182 05/21/2018    CMP:  Lab Results  Component Value Date   NA 138 05/21/2018   K 4.3 05/21/2018   CL 108 05/21/2018   CO2 24 05/21/2018   BUN 19 05/21/2018   CREATININE 0.49 05/21/2018  .   Total Time in preparing paper work, data evaluation and todays exam - 35 minutes  Huey Bienenstock M.D on 05/21/2018 at 5:11 PM  [facility]   Office  [phone]

## 2018-05-22 ENCOUNTER — Telehealth: Payer: Self-pay | Admitting: General Practice

## 2018-05-22 ENCOUNTER — Encounter: Payer: Self-pay | Admitting: *Deleted

## 2018-05-22 ENCOUNTER — Other Ambulatory Visit: Payer: Self-pay | Admitting: Pharmacist

## 2018-05-22 LAB — GLUCOSE, CAPILLARY
GLUCOSE-CAPILLARY: 437 mg/dL — AB (ref 70–99)
GLUCOSE-CAPILLARY: 332 mg/dL — AB (ref 70–99)
GLUCOSE-CAPILLARY: 334 mg/dL — AB (ref 70–99)
GLUCOSE-CAPILLARY: 193 mg/dL — AB (ref 70–99)
Glucose-Capillary: 486 mg/dL — ABNORMAL HIGH (ref 70–99)
Glucose-Capillary: 541 mg/dL (ref 70–99)

## 2018-05-22 MED ORDER — ALBUTEROL SULFATE HFA 108 (90 BASE) MCG/ACT IN AERS: 1.0000 | INHALATION_SPRAY | Freq: Four times a day (QID) | RESPIRATORY_TRACT | 0 refills | Status: DC | PRN

## 2018-05-22 MED FILL — ALBUTEROL SULFATE HFA 108 (: 108 (90 BAS | 25 days supply | Qty: 9 | Fill #0

## 2018-05-22 NOTE — Telephone Encounter (Signed)
Patient would like

## 2018-05-22 NOTE — Progress Notes (Signed)
Pt and husband walk-in to ED regarding D/C instructions for filling Rx.  EDCM reviewed chart to find CM advised pt to fill at [facility] pharmacy.  EDCM reviewed instructions via spanish interpreter Darrel Hoover) to take Rx to [facility] pharmacy to fill. If any problems to call [facility].

## 2018-05-25 ENCOUNTER — Emergency Department (HOSPITAL_COMMUNITY): Payer: Self-pay

## 2018-05-25 ENCOUNTER — Other Ambulatory Visit: Payer: Self-pay

## 2018-05-25 ENCOUNTER — Inpatient Hospital Stay (HOSPITAL_COMMUNITY): Payer: Self-pay

## 2018-05-25 ENCOUNTER — Inpatient Hospital Stay (HOSPITAL_COMMUNITY)
Admission: EM | Admit: 2018-05-25 | Discharge: 2018-05-28 | DRG: 202 | Disposition: A | Discharge status: Home / Self Care | Payer: Self-pay | Attending: Internal Medicine | Admitting: Internal Medicine

## 2018-05-25 ENCOUNTER — Encounter (HOSPITAL_COMMUNITY): Payer: Self-pay | Admitting: Family Medicine

## 2018-05-25 DIAGNOSIS — J9601 Acute respiratory failure with hypoxia: Secondary | ICD-10-CM | POA: Diagnosis present

## 2018-05-25 DIAGNOSIS — J45909 Unspecified asthma, uncomplicated: Secondary | ICD-10-CM | POA: Diagnosis present

## 2018-05-25 DIAGNOSIS — J4551 Severe persistent asthma with (acute) exacerbation: Principal | ICD-10-CM | POA: Diagnosis present

## 2018-05-25 DIAGNOSIS — Z794 Long term (current) use of insulin: Secondary | ICD-10-CM

## 2018-05-25 DIAGNOSIS — Z825 Family history of asthma and other chronic lower respiratory diseases: Secondary | ICD-10-CM

## 2018-05-25 DIAGNOSIS — J9602 Acute respiratory failure with hypercapnia: Secondary | ICD-10-CM | POA: Diagnosis present

## 2018-05-25 DIAGNOSIS — Z7952 Long term (current) use of systemic steroids: Secondary | ICD-10-CM

## 2018-05-25 DIAGNOSIS — J45901 Unspecified asthma with (acute) exacerbation: Secondary | ICD-10-CM

## 2018-05-25 DIAGNOSIS — Z79899 Other long term (current) drug therapy: Secondary | ICD-10-CM

## 2018-05-25 DIAGNOSIS — IMO0002 Reserved for concepts with insufficient information to code with codable children: Secondary | ICD-10-CM

## 2018-05-25 DIAGNOSIS — E1165 Type 2 diabetes mellitus with hyperglycemia: Secondary | ICD-10-CM

## 2018-05-25 DIAGNOSIS — E119 Type 2 diabetes mellitus without complications: Secondary | ICD-10-CM | POA: Diagnosis present

## 2018-05-25 LAB — BASIC METABOLIC PANEL
ANION GAP: 9 (ref 5–15)
BUN: 13 mg/dL (ref 6–20)
CALCIUM: 10 mg/dL (ref 8.9–10.3)
CO2: 29 mmol/L (ref 22–32)
Chloride: 100 mmol/L (ref 98–111)
Creatinine, Ser: 0.5 mg/dL (ref 0.44–1.00)
GFR calc non Af Amer: >60 mL/min (ref >60)
Glucose, Bld: 259 mg/dL — ABNORMAL HIGH (ref 70–99)
Potassium: 4.1 mmol/L (ref 3.5–5.1)
Sodium: 138 mmol/L (ref 135–145)

## 2018-05-25 LAB — BLOOD GAS, ARTERIAL
ACID-BASE DEFICIT: 0 mmol/L (ref 0.0–2.0)
Acid-Base Excess: 1.3 mmol/L (ref 0.0–2.0)
BICARBONATE: 23.9 mmol/L (ref 20.0–28.0)
Bicarbonate: 23.3 mmol/L (ref 20.0–28.0)
DELIVERY SYSTEMS: POSITIVE
DRAWN BY: 382351
Delivery systems: POSITIVE
Drawn by: 382351
EXPIRATORY PAP: 6
FIO2: 50
FIO2: 50
Inspiratory PAP: 14
LHR: 8 {breaths}/min
O2 SAT: 98.6 %
O2 Saturation: 94.3 %
PATIENT TEMPERATURE: 37
PCO2 ART: 46.3 mmHg (ref 32.0–48.0)
PH ART: 7.26 — AB (ref 7.350–7.450)
PO2 ART: 136 mmHg — AB (ref 83.0–108.0)
Patient temperature: 37
RATE: 21 resp/min
pCO2 arterial: 63.9 mmHg — ABNORMAL HIGH (ref 32.0–48.0)
pH, Arterial: 7.35 (ref 7.350–7.450)
pO2, Arterial: 86.8 mmHg (ref 83.0–108.0)

## 2018-05-25 LAB — CBC WITH DIFFERENTIAL/PLATELET
BASOS PCT: 1 %
Basophils Absolute: 0.1 10*3/uL (ref 0.0–0.1)
EOS ABS: 1.5 10*3/uL — AB (ref 0.0–0.7)
Eosinophils Relative: 12 %
HCT: 53.8 % — ABNORMAL HIGH (ref 36.0–46.0)
HEMOGLOBIN: 18.9 g/dL — AB (ref 12.0–15.0)
Lymphocytes Relative: 29 %
Lymphs Abs: 3.7 10*3/uL (ref 0.7–4.0)
MCH: 32.1 pg (ref 26.0–34.0)
MCHC: 35.1 g/dL (ref 30.0–36.0)
MCV: 91.5 fL (ref 78.0–100.0)
MONO ABS: 0.7 10*3/uL (ref 0.1–1.0)
MONOS PCT: 5 %
NEUTROS PCT: 53 %
Neutro Abs: 7 10*3/uL (ref 1.7–7.7)
Platelets: 191 10*3/uL (ref 150–400)
RBC: 5.88 MIL/uL — ABNORMAL HIGH (ref 3.87–5.11)
RDW: 13.1 % (ref 11.5–15.5)
WBC: 12.9 10*3/uL — ABNORMAL HIGH (ref 4.0–10.5)

## 2018-05-25 LAB — GLUCOSE, CAPILLARY
GLUCOSE-CAPILLARY: 309 mg/dL — AB (ref 70–99)
GLUCOSE-CAPILLARY: 298 mg/dL — AB (ref 70–99)
GLUCOSE-CAPILLARY: 285 mg/dL — AB (ref 70–99)
GLUCOSE-CAPILLARY: 255 mg/dL — AB (ref 70–99)
Glucose-Capillary: 210 mg/dL — ABNORMAL HIGH (ref 70–99)

## 2018-05-25 LAB — I-STAT BETA HCG BLOOD, ED (MC, WL, AP ONLY)

## 2018-05-25 LAB — BRAIN NATRIURETIC PEPTIDE: B Natriuretic Peptide: 33 pg/mL (ref 0.0–100.0)

## 2018-05-25 LAB — CBG MONITORING, ED: Glucose-Capillary: 258 mg/dL — ABNORMAL HIGH (ref 70–99)

## 2018-05-25 LAB — TROPONIN I: Troponin I: <0.03 ng/mL (ref <0.03)

## 2018-05-25 LAB — I-STAT CG4 LACTIC ACID, ED: Lactic Acid, Venous: 1.57 mmol/L (ref 0.5–1.9)

## 2018-05-25 MED ORDER — MIDAZOLAM HCL 2 MG/2ML IJ SOLN
INTRAMUSCULAR | Status: AC
  Filled 2018-05-25: qty 2

## 2018-05-25 MED ORDER — INSULIN ASPART 100 UNIT/ML ~~LOC~~ SOLN
0.0000 [IU] | SUBCUTANEOUS | Status: DC
  Administered 2018-05-25: 8 [IU] via SUBCUTANEOUS
  Administered 2018-05-25: 11 [IU] via SUBCUTANEOUS
  Administered 2018-05-25 (×2): 8 [IU] via SUBCUTANEOUS

## 2018-05-25 MED ORDER — METHYLPREDNISOLONE SODIUM SUCC 125 MG IJ SOLR
125.0000 mg | Freq: Once | INTRAMUSCULAR | Status: AC
  Administered 2018-05-25: 125 mg via INTRAVENOUS

## 2018-05-25 MED ORDER — LIVING WELL WITH DIABETES BOOK - IN SPANISH
Freq: Once | Status: DC
  Filled 2018-05-25: qty 1

## 2018-05-25 MED ORDER — IPRATROPIUM-ALBUTEROL 0.5-2.5 (3) MG/3ML IN SOLN
RESPIRATORY_TRACT | Status: AC
  Administered 2018-05-25: 3 mL
  Filled 2018-05-25: qty 3

## 2018-05-25 MED ORDER — LORAZEPAM 2 MG/ML IJ SOLN: 0.5000 mg | INTRAMUSCULAR | Status: DC | PRN

## 2018-05-25 MED ORDER — SODIUM CHLORIDE 0.9 % IV SOLN
INTRAVENOUS | Status: AC
  Administered 2018-05-25: 04:00:00 via INTRAVENOUS

## 2018-05-25 MED ORDER — ONDANSETRON HCL 4 MG/2ML IJ SOLN: 4.0000 mg | Freq: Four times a day (QID) | INTRAMUSCULAR | Status: DC | PRN

## 2018-05-25 MED ORDER — INSULIN ASPART 100 UNIT/ML ~~LOC~~ SOLN
0.0000 [IU] | Freq: Three times a day (TID) | SUBCUTANEOUS | Status: DC
  Administered 2018-05-26: 5 [IU] via SUBCUTANEOUS
  Administered 2018-05-26: 15 [IU] via SUBCUTANEOUS
  Administered 2018-05-26: 8 [IU] via SUBCUTANEOUS
  Administered 2018-05-27: 5 [IU] via SUBCUTANEOUS
  Administered 2018-05-27 (×2): 8 [IU] via SUBCUTANEOUS
  Administered 2018-05-28: 3 [IU] via SUBCUTANEOUS
  Administered 2018-05-28: 8 [IU] via SUBCUTANEOUS

## 2018-05-25 MED ORDER — IPRATROPIUM-ALBUTEROL 0.5-2.5 (3) MG/3ML IN SOLN
3.0000 mL | RESPIRATORY_TRACT | Status: DC
  Administered 2018-05-25 – 2018-05-27 (×12): 3 mL via RESPIRATORY_TRACT
  Filled 2018-05-25 (×12): qty 3

## 2018-05-25 MED ORDER — ACETAMINOPHEN 650 MG RE SUPP: 650.0000 mg | Freq: Four times a day (QID) | RECTAL | Status: DC | PRN

## 2018-05-25 MED ORDER — ORAL CARE MOUTH RINSE
15.0000 mL | Freq: Two times a day (BID) | OROMUCOSAL | Status: DC
  Administered 2018-05-25 – 2018-05-27 (×4): 15 mL via OROMUCOSAL

## 2018-05-25 MED ORDER — ENOXAPARIN SODIUM 40 MG/0.4ML ~~LOC~~ SOLN
40.0000 mg | SUBCUTANEOUS | Status: DC
  Administered 2018-05-25 – 2018-05-28 (×4): 40 mg via SUBCUTANEOUS
  Filled 2018-05-25 (×4): qty 0.4

## 2018-05-25 MED ORDER — LACTATED RINGERS IV BOLUS
2000.0000 mL | Freq: Once | INTRAVENOUS | Status: AC
  Administered 2018-05-25: 2000 mL via INTRAVENOUS

## 2018-05-25 MED ORDER — INSULIN ASPART PROT & ASPART (70-30 MIX) 100 UNIT/ML ~~LOC~~ SUSP
30.0000 [IU] | Freq: Two times a day (BID) | SUBCUTANEOUS | Status: DC
  Administered 2018-05-25 – 2018-05-26 (×2): 30 [IU] via SUBCUTANEOUS
  Filled 2018-05-25: qty 10

## 2018-05-25 MED ORDER — INSULIN DETEMIR 100 UNIT/ML ~~LOC~~ SOLN
17.0000 [IU] | Freq: Two times a day (BID) | SUBCUTANEOUS | Status: DC
  Filled 2018-05-25 (×5): qty 0.17

## 2018-05-25 MED ORDER — ALBUTEROL (5 MG/ML) CONTINUOUS INHALATION SOLN
INHALATION_SOLUTION | RESPIRATORY_TRACT | Status: AC
  Administered 2018-05-25: 10 mg
  Filled 2018-05-25: qty 20

## 2018-05-25 MED ORDER — ALBUTEROL SULFATE (2.5 MG/3ML) 0.083% IN NEBU
INHALATION_SOLUTION | RESPIRATORY_TRACT | Status: AC
  Administered 2018-05-25: 2.5 mg
  Filled 2018-05-25: qty 3

## 2018-05-25 MED ORDER — LORATADINE 10 MG PO TABS
10.0000 mg | ORAL_TABLET | Freq: Every day | ORAL | Status: DC
  Administered 2018-05-25 – 2018-05-28 (×4): 10 mg via ORAL
  Filled 2018-05-25 (×4): qty 1

## 2018-05-25 MED ORDER — IPRATROPIUM-ALBUTEROL 0.5-2.5 (3) MG/3ML IN SOLN
3.0000 mL | Freq: Four times a day (QID) | RESPIRATORY_TRACT | Status: DC
  Administered 2018-05-25 (×3): 3 mL via RESPIRATORY_TRACT
  Filled 2018-05-25 (×3): qty 3

## 2018-05-25 MED ORDER — BUDESONIDE 0.5 MG/2ML IN SUSP
0.5000 mg | Freq: Two times a day (BID) | RESPIRATORY_TRACT | Status: DC
  Administered 2018-05-26 – 2018-05-28 (×5): 0.5 mg via RESPIRATORY_TRACT
  Filled 2018-05-25 (×5): qty 2

## 2018-05-25 MED ORDER — MAGNESIUM SULFATE 2 GM/50ML IV SOLN
2.0000 g | Freq: Once | INTRAVENOUS | Status: AC
  Administered 2018-05-25: 2 g via INTRAVENOUS

## 2018-05-25 MED ORDER — MAGNESIUM SULFATE 2 GM/50ML IV SOLN
INTRAVENOUS | Status: AC
  Administered 2018-05-25: 2 g via INTRAVENOUS
  Filled 2018-05-25: qty 50

## 2018-05-25 MED ORDER — INSULIN ASPART 100 UNIT/ML ~~LOC~~ SOLN
0.0000 [IU] | Freq: Every day | SUBCUTANEOUS | Status: DC
  Administered 2018-05-25: 2 [IU] via SUBCUTANEOUS
  Administered 2018-05-26: 4 [IU] via SUBCUTANEOUS

## 2018-05-25 MED ORDER — METHYLPREDNISOLONE SODIUM SUCC 125 MG IJ SOLR
INTRAMUSCULAR | Status: AC
  Administered 2018-05-25: 125 mg via INTRAVENOUS
  Filled 2018-05-25: qty 2

## 2018-05-25 MED ORDER — IPRATROPIUM-ALBUTEROL 0.5-2.5 (3) MG/3ML IN SOLN
3.0000 mL | Freq: Once | RESPIRATORY_TRACT | Status: AC
  Administered 2018-05-25: 3 mL via RESPIRATORY_TRACT
  Filled 2018-05-25: qty 3

## 2018-05-25 MED ORDER — BUDESONIDE 0.5 MG/2ML IN SUSP
RESPIRATORY_TRACT | Status: AC
  Administered 2018-05-25: 0.5 mg
  Filled 2018-05-25: qty 2

## 2018-05-25 MED ORDER — SODIUM CHLORIDE 0.9% FLUSH
3.0000 mL | Freq: Two times a day (BID) | INTRAVENOUS | Status: DC
  Administered 2018-05-25 – 2018-05-27 (×5): 3 mL via INTRAVENOUS

## 2018-05-25 MED ORDER — THEOPHYLLINE ER 200 MG PO CP24: 200.0000 mg | ORAL_CAPSULE | Freq: Two times a day (BID) | ORAL | Status: DC

## 2018-05-25 MED ORDER — ONDANSETRON HCL 4 MG PO TABS: 4.0000 mg | ORAL_TABLET | Freq: Four times a day (QID) | ORAL | Status: DC | PRN

## 2018-05-25 MED ORDER — METHYLPREDNISOLONE SODIUM SUCC 125 MG IJ SOLR
60.0000 mg | Freq: Four times a day (QID) | INTRAMUSCULAR | Status: DC
  Administered 2018-05-25 – 2018-05-28 (×12): 60 mg via INTRAVENOUS
  Filled 2018-05-25 (×13): qty 2

## 2018-05-25 MED ORDER — SODIUM CHLORIDE 0.9 % IV SOLN: Freq: Once | INTRAVENOUS | Status: DC

## 2018-05-25 MED ORDER — SODIUM CHLORIDE 0.9% FLUSH
3.0000 mL | Freq: Two times a day (BID) | INTRAVENOUS | Status: DC
  Administered 2018-05-25 – 2018-05-28 (×6): 3 mL via INTRAVENOUS

## 2018-05-25 MED ORDER — MORPHINE SULFATE (PF) 2 MG/ML IV SOLN: 1.0000 mg | INTRAVENOUS | Status: DC | PRN

## 2018-05-25 MED ORDER — CHLORHEXIDINE GLUCONATE 0.12 % MT SOLN
15.0000 mL | Freq: Two times a day (BID) | OROMUCOSAL | Status: DC
  Administered 2018-05-25: 15 mL via OROMUCOSAL
  Filled 2018-05-25: qty 15

## 2018-05-25 MED ORDER — ALBUTEROL SULFATE (2.5 MG/3ML) 0.083% IN NEBU
2.5000 mg | INHALATION_SOLUTION | RESPIRATORY_TRACT | Status: DC | PRN
  Administered 2018-05-25: 2.5 mg via RESPIRATORY_TRACT

## 2018-05-25 MED ORDER — SENNOSIDES-DOCUSATE SODIUM 8.6-50 MG PO TABS: 1.0000 | ORAL_TABLET | Freq: Every evening | ORAL | Status: DC | PRN

## 2018-05-25 MED ORDER — IPRATROPIUM BROMIDE 0.02 % IN SOLN
RESPIRATORY_TRACT | Status: AC
  Administered 2018-05-25: 0.5 mg
  Filled 2018-05-25: qty 2.5

## 2018-05-25 MED ORDER — ACETAMINOPHEN 325 MG PO TABS: 650.0000 mg | ORAL_TABLET | Freq: Four times a day (QID) | ORAL | Status: DC | PRN

## 2018-05-25 MED ORDER — MIDAZOLAM HCL 2 MG/2ML IJ SOLN
2.0000 mg | Freq: Once | INTRAMUSCULAR | Status: AC
  Administered 2018-05-25: 2 mg via INTRAVENOUS

## 2018-05-25 MED ORDER — ALBUTEROL SULFATE (2.5 MG/3ML) 0.083% IN NEBU
INHALATION_SOLUTION | RESPIRATORY_TRACT | Status: AC
  Filled 2018-05-25: qty 3

## 2018-05-25 NOTE — Progress Notes (Signed)
Patient wheezing has increased with decreased breath sounds. Nebs have been increased to q4, Inhaled steroid has been added Pulmicort  0.5mg  neb bid

## 2018-05-25 NOTE — ED Triage Notes (Signed)
Pt arrived POV with shortness of breath due to history of asthma.

## 2018-05-25 NOTE — Progress Notes (Signed)
Patient seen and examined, database reviewed.  No family members at bedside.  Patient was admitted early this morning due to acute on chronic hypercarbic respiratory failure due to asthma exacerbation.  She has been intubated for her asthma in the past.  She was just discharged from [facility] 5 days ago for treatment of asthma exacerbation, at that time did not require mechanical ventilation.  She states she was unable to fill out her prescriptions and got progressively worse until she decided to seek medical attention yesterday.  She was found to have significant respiratory distress, ABG showed hypercarbia with a PCO2 of 69 and a pH of 7.26, was started on BiPAP.  She is significantly improved this morning, has been weaned off BiPAP, repeat ABG with pH of 7.35 and a PCO2 of 46.  She states she feels much better, still has significant wheezing on exam, continue steroids, nebs, will watch off BiPAP for now.  Repeat chest x-ray and ABG in a.m.  In regards to her diabetes, as she has constraining issues with being able to afford her medications, will transition her back to 7030 so we can adequately assess her needs while hospitalized.  We will continue to follow.  Peggye Pitt, MD [facility] Pager: [phone]

## 2018-05-25 NOTE — Progress Notes (Signed)
Patient successfully transported to ICU on BIPAP. No adverse events noted. Patient handed off to ICU RRT.

## 2018-05-25 NOTE — ED Provider Notes (Signed)
[facility] EMERGENCY DEPARTMENT Provider Note   CSN: [id number] Arrival date & time: 05/25/18  0031     History   Chief Complaint Chief Complaint  Patient presents with  . Shortness of Breath    HPI Margaret Santana is a 47 y.o. female.  HPI  Level 5 caveat for respiratory distress.  Patient comes in with chief complaint of shortness of breath. Translation services utilized.   Patient reports that she has been getting short of breath the last couple of days.  This morning her breathing started getting really bad and in the afternoon she started having diaphoresis.  Because of her worsening symptoms she finally came in via private conveyance to the emergency room.  Patient has had persistent cough for the last few days.  He denies any chest pain.  She has history of asthma which was diagnosed while she was still in [place].  Patient just moved to [place] about 2 weeks ago.  Her inhaler from [place] was not working and she just needed an admission to the hospital few days ago for asthma exacerbation.  At that visit she was diagnosed with diabetes as well.  Upon discharge patient has been using inhaler without significant relief.  She denies any fevers, chills.   Pt has no hx of PE, DVT and denies any exogenous hormone (testosterone / estrogen) use, surgery in the past 6 weeks, active cancer or recent immobilization.  Patient did travel from [place] and states that she might have been stationary for prolonged period of time during her transit.  Pt typically gets a couple of asthma attacks in a year.  She normally does not need to go to the hospital and has never been on BiPAP or intubated.  Patient denies smoking or substance abuse.  History reviewed. No pertinent past medical history.  Patient Active Problem List   Diagnosis Date Noted  . Insulin-requiring or dependent type II diabetes mellitus (HCC) 05/25/2018  . Acute hypercapnic respiratory failure (HCC) 05/25/2018  . Acute  asthma exacerbation 05/25/2018  . Asthma exacerbation 05/20/2018  . Hyperglycemia 05/20/2018  . Obesity 05/20/2018    Past Surgical History:  Procedure Laterality Date  . CESAREAN SECTION       OB History   None      Home Medications    Prior to Admission medications   Medication Sig Start Date End Date Taking? Authorizing Provider  albuterol (PROVENTIL HFA;VENTOLIN HFA) 108 (90 Base) MCG/ACT inhaler Inhale 1-2 puffs into the lungs every 6 (six) hours as needed for wheezing or shortness of breath. 05/22/18   Hoy Register, MD  Insulin Isophane & Regular Human (NOVOLIN 70/30 FLEXPEN RELION) (70-30) 100 UNIT/ML PEN Inject 30 Units into the skin 2 (two) times daily before a meal. 05/21/18   Elgergawy, Leana Roe, MD  Insulin Pen Needle (PEN NEEDLES 3/16") 31G X 5 MM MISC Use with insulin 05/21/18   Elgergawy, Leana Roe, MD  Lancets (FREESTYLE) lancets Use as instructed 05/21/18   Elgergawy, Leana Roe, MD  loratadine (CLARITIN) 10 MG tablet Take 10 mg by mouth daily.    [provider]  metFORMIN (GLUCOPHAGE) 500 MG tablet Take 1 tablet (500 mg total) by mouth 2 (two) times daily with a meal. 05/21/18   Elgergawy, Leana Roe, MD  predniSONE (DELTASONE) 10 MG tablet Take 40 mg oral for 2 days, then 30 mg oral for 2 days, then 20 mg oral for 2 days, then 10 mg oral for 2 days. 05/21/18   Elgergawy,  Leana Roe, MD  Theophylline (THEO-24 PO) Take 1 tablet by mouth 2 (two) times daily.    [provider]    Family History Family History  Problem Relation Age of Onset  . Asthma Mother     Social History Social History   Tobacco Use  . Smoking status: Never Smoker  . Smokeless tobacco: Never Used  Substance Use Topics  . Alcohol use: Never    Frequency: Never  . Drug use: Never     Allergies   Patient has no known allergies.   Review of Systems Review of Systems  Unable to perform ROS: Severe respiratory distress     Physical Exam Updated Vital Signs BP 131/81    Pulse (!) 104   Resp (!) 24   Ht 5\' 3"  (1.6 m)   Wt 73 kg   LMP 05/11/2018   SpO2 98%   BMI 28.51 kg/m   Physical Exam  Constitutional: She is oriented to person, place, and time. She appears well-developed. She appears distressed.  Diaphoretic and in respiratory distress  HENT:  Head: Atraumatic.  Eyes: EOM are normal.  Neck: Neck supple.  Cardiovascular: Regular rhythm.  Tachycardic  Pulmonary/Chest: Accessory muscle usage present. No stridor. Tachypnea noted. She is in respiratory distress. She has wheezes in the right upper field, the right middle field, the right lower field, the left upper field, the left middle field and the left lower field. She has no rhonchi. She has no rales.  Abdominal: Soft.  Musculoskeletal:       Right lower leg: Normal.       Left lower leg: Normal.  Neurological: She is alert and oriented to person, place, and time.  Skin: Skin is warm and dry.  Nursing note and vitals reviewed.    ED Treatments / Results  Labs (all labs ordered are listed, but only abnormal results are displayed) Labs Reviewed  BASIC METABOLIC PANEL - Abnormal; Notable for the following components:      Result Value   Glucose, Bld 259 (*)    All other components within normal limits  CBC WITH DIFFERENTIAL/PLATELET - Abnormal; Notable for the following components:   WBC 12.9 (*)    RBC 5.88 (*)    Hemoglobin 18.9 (*)    HCT 53.8 (*)    Eosinophils Absolute 1.5 (*)    All other components within normal limits  BLOOD GAS, ARTERIAL - Abnormal; Notable for the following components:   pH, Arterial 7.260 (*)    pCO2 arterial 63.9 (*)    All other components within normal limits  BLOOD GAS, ARTERIAL - Abnormal; Notable for the following components:   pO2, Arterial 136 (*)    All other components within normal limits  CBG MONITORING, ED - Abnormal; Notable for the following components:   Glucose-Capillary 258 (*)    All other components within normal limits  BRAIN  NATRIURETIC PEPTIDE  TROPONIN I  I-STAT BETA HCG BLOOD, ED (MC, [facility], AP ONLY)  I-STAT CG4 LACTIC ACID, ED    EKG EKG Interpretation  Date/Time:  Saturday May 25 2018 00:43:47 EDT Ventricular Rate:  114 PR Interval:    QRS Duration: 78 QT Interval:  335 QTC Calculation: 462 R Axis:   0 Text Interpretation:  Sinus tachycardia Anteroseptal infarct, old No acute changes No old tracing to compare Confirmed by Derwood Kaplan ([id number]) on 05/25/2018 3:02:26 AM   Radiology Dg Chest Port 1 View  Result Date: 05/25/2018 CLINICAL DATA:  Shortness of breath  EXAM: PORTABLE CHEST 1 VIEW COMPARISON:  Chest radiograph 05/20/2018 FINDINGS: The heart size and mediastinal contours are within normal limits. Both lungs are clear. The visualized skeletal structures are unremarkable. IMPRESSION: No active disease. Electronically Signed   By: Deatra Robinson M.D.   On: 05/25/2018 01:44    Procedures Procedures (including critical care time)  CRITICAL CARE Performed by: Ginnie Marich   Total critical care time: 90 minutes  Critical care time was exclusive of separately billable procedures and treating other patients.  Critical care was necessary to treat or prevent imminent or life-threatening deterioration.  Critical care was time spent personally by me on the following activities: development of treatment plan with patient and/or surrogate as well as nursing, discussions with consultants, evaluation of patient's response to treatment, examination of patient, obtaining history from patient or surrogate, ordering and performing treatments and interventions, ordering and review of laboratory studies, ordering and review of radiographic studies, pulse oximetry and re-evaluation of patient's condition.   Medications Ordered in ED Medications  midazolam (VERSED) 2 MG/2ML injection (  Not Given 05/25/18 0142)  ipratropium-albuterol (DUONEB) 0.5-2.5 (3) MG/3ML nebulizer solution 3 mL (has no  administration in time range)  loratadine (CLARITIN) tablet 10 mg (has no administration in time range)  insulin detemir (LEVEMIR) injection 17 Units (has no administration in time range)  insulin aspart (novoLOG) injection 0-15 Units (has no administration in time range)  enoxaparin (LOVENOX) injection 40 mg (has no administration in time range)  sodium chloride flush (NS) 0.9 % injection 3 mL (has no administration in time range)  0.9 %  sodium chloride infusion ( Intravenous New Bag/Given 05/25/18 0407)  acetaminophen (TYLENOL) tablet 650 mg (has no administration in time range)    Or  acetaminophen (TYLENOL) suppository 650 mg (has no administration in time range)  senna-docusate (Senokot-S) tablet 1 tablet (has no administration in time range)  ondansetron (ZOFRAN) tablet 4 mg (has no administration in time range)    Or  ondansetron (ZOFRAN) injection 4 mg (has no administration in time range)  LORazepam (ATIVAN) injection 0.5 mg (has no administration in time range)  albuterol (PROVENTIL) (2.5 MG/3ML) 0.083% nebulizer solution 2.5 mg (has no administration in time range)  methylPREDNISolone sodium succinate (SOLU-MEDROL) 125 mg/2 mL injection 60 mg (has no administration in time range)  morphine 2 MG/ML injection 1-3 mg (has no administration in time range)  ipratropium-albuterol (DUONEB) 0.5-2.5 (3) MG/3ML nebulizer solution (3 mLs  Given 05/25/18 0043)  albuterol (PROVENTIL) (2.5 MG/3ML) 0.083% nebulizer solution (2.5 mg  Given 05/25/18 0043)  ipratropium (ATROVENT) 0.02 % nebulizer solution (0.5 mg  Given 05/25/18 0049)  albuterol (PROVENTIL, VENTOLIN) (5 MG/ML) 0.5% continuous inhalation solution (10 mg  Given 05/25/18 0049)  midazolam (VERSED) injection 2 mg (2 mg Intravenous Given 05/25/18 0120)  methylPREDNISolone sodium succinate (SOLU-MEDROL) 125 mg/2 mL injection 125 mg (125 mg Intravenous Not Given 05/25/18 0225)  magnesium sulfate IVPB 2 g 50 mL (0 g Intravenous Stopped 05/25/18 0240)    lactated ringers bolus 2,000 mL (0 mLs Intravenous Stopped 05/25/18 0349)  ipratropium-albuterol (DUONEB) 0.5-2.5 (3) MG/3ML nebulizer solution (3 mLs  Given 05/25/18 0202)  albuterol (PROVENTIL) (2.5 MG/3ML) 0.083% nebulizer solution (2.5 mg  Given 05/25/18 0202)     Initial Impression / Assessment and Plan / ED Course  I have reviewed the triage vital signs and the nursing notes.  Pertinent labs & imaging results that were available during my care of the patient were reviewed by me and considered in my  medical decision making (see chart for details).  Clinical Course as of May 25 416  Sat May 25, 2018  0145 Nurses informed me that patient was having difficulty again tolerating the BiPAP.  She had received 1 mg of Versed.  We will give another were set at this time.  Continue to closely monitor.   [AN]  0200 Patient is tolerating BiPAP. She was given Versed.  Her initial ABG shows hypercapnic respiratory failure. X-ray does not reveal any signs of infection. Respiratory rate continues to be in the upper 20s and patient still has wheezing.  We will continue with aggressive treatment and close monitoring.   [AN]  0330 I discussed the case with the pulmonary critical care.  Patient was noted to have hypercapnic respiratory failure on ABG.  It is my belief that she had severe asthma exacerbation that led to the hypercapnia. CCM team agrees with the management thus far. They recommend that if the repeat ABG looks better and patient is tolerating BiPAP then she can likely stay at any pain hospital.  If the hospitalist is uncomfortable for the patient or ABG are deteriorating -then we need to intubate the patient.  Patient continues to be tachypneic, however there is no accessory muscle use and she is not into respiratory distress. We will admit her to hospitalist service. I asked Dr. Antionette Char to let me know if he thinks patient is too sick for admission here.  Blood gas, arterial(!) [AN]  0417 In total  patient has received 20 mg of albuterol and 1.5 mg of Atrovent.  She still wheezing, but her aeration has improved   [AN]    Clinical Course User Index [AN] Derwood Kaplan, MD    Pt comes in with cc of DIB. She is found to be in acute respiratory distress with diaphoresis. Pt is wheezing and we are concerned for severe asthma exacerbation.  Additionally, differential includes acute pulmonary edema, ACS, multifocal pneumonia and PE.   Patient was just seen few days ago for asthma exacerbation.  It appears that she felt better at the time of discharge but then quickly started feeling worse again.  Besides recent long distance travel, there is no risk factors for PE.  No evidence of volume overload on her exam.  We will start her on nebulizer treatment and continue with the BiPAP.  IV Solu-Medrol and magnesium ordered.  2 L of IV fluid given because she likely has had significant insensible fluid loss.  Patient will need close monitoring.  If she continues to deteriorate then we will need to intubate her, and if she does require intubation then we will get a CT angiogram to rule out PE.  Patient will be given Versed because she is having difficulty tolerating the BiPAP.  We will also consider ketamine if needed to help with her symptoms.  Final Clinical Impressions(s) / ED Diagnoses   Final diagnoses:  Acute respiratory failure with hypoxia and hypercapnia (HCC)  Severe persistent asthma with exacerbation    ED Discharge Orders    None       Derwood Kaplan, MD 05/25/18 [phone]

## 2018-05-25 NOTE — H&P (Signed)
History and Physical    Margaret Santana AJO:[id number] DOB: Apr 09, 1971 DOA: 05/25/2018  PCP: Patient, No Pcp Per   Patient coming from: Home   Chief Complaint: SOB   HPI: Margaret Santana is a 47 y.o. female with medical history significant for asthma, never requiring intubation, and now presenting with worsening shortness of breath and wheezing.  Patient was admitted to the hospital with acute asthma exacerbation 1 week ago, improved significantly with treatment and was discharged home with prednisone taper on 05/21/2018, reports that she never quite returned to her baseline, but was able to resume her usual activities until yesterday when she began to worsen.  Since that time, she has had increasing shortness of breath and wheezing, became significantly short of breath even at rest yesterday evening, and eventually came into the ED for evaluation.  She denies any chest pain, fevers, hemoptysis, history of DVT or PE, or lower extremity tenderness.  She reports an occasional cough productive of scant clear sputum.  ED Course: Upon arrival to the ED, patient is found to be in acute respiratory distress with mild tachycardia and stable blood pressure.  EKG features sinus tachycardia with rate 114 and chest x-ray is negative for acute cardiopulmonary disease.  Chemistry panel is notable for serum glucose of 259 and CBC features a leukocytosis to 12,900 and a mild polycythemia.  Lactic acid is reassuringly normal, troponin is undetectable, and BNP is normal.  Patient was given 2 L of lactated Ringer's, 125 mg IV Solu-Medrol, 2 g IV magnesium, Versed, and was placed on BiPAP.  She reports subjective improvement with treatment in the ED, work of breathing and respiratory rate have improved, and hypercapnia has resolved.  She remains dyspneic and tachypneic at rest, however, has been continued on BiPAP, and will require admission for ongoing evaluation and management.  Review of Systems:  All  other systems reviewed and apart from HPI, are negative.  History reviewed. No pertinent past medical history.  Past Surgical History:  Procedure Laterality Date  . CESAREAN SECTION       reports that she has never smoked. She has never used smokeless tobacco. She reports that she does not drink alcohol or use drugs.  No Known Allergies  Family History  Problem Relation Age of Onset  . Asthma Mother      Prior to Admission medications   Medication Sig Start Date End Date Taking? Authorizing Provider  albuterol (PROVENTIL HFA;VENTOLIN HFA) 108 (90 Base) MCG/ACT inhaler Inhale 1-2 puffs into the lungs every 6 (six) hours as needed for wheezing or shortness of breath. 05/22/18   Hoy Register, MD  Insulin Isophane & Regular Human (NOVOLIN 70/30 FLEXPEN RELION) (70-30) 100 UNIT/ML PEN Inject 30 Units into the skin 2 (two) times daily before a meal. 05/21/18   Elgergawy, Leana Roe, MD  Insulin Pen Needle (PEN NEEDLES 3/16") 31G X 5 MM MISC Use with insulin 05/21/18   Elgergawy, Leana Roe, MD  Lancets (FREESTYLE) lancets Use as instructed 05/21/18   Elgergawy, Leana Roe, MD  loratadine (CLARITIN) 10 MG tablet Take 10 mg by mouth daily.    [provider]  metFORMIN (GLUCOPHAGE) 500 MG tablet Take 1 tablet (500 mg total) by mouth 2 (two) times daily with a meal. 05/21/18   Elgergawy, Leana Roe, MD  predniSONE (DELTASONE) 10 MG tablet Take 40 mg oral for 2 days, then 30 mg oral for 2 days, then 20 mg oral for 2 days, then 10 mg oral for 2 days.  05/21/18   Elgergawy, Leana Roe, MD  Theophylline (THEO-24 PO) Take 1 tablet by mouth 2 (two) times daily.    [provider]    Physical Exam: Vitals:   05/25/18 0203 05/25/18 0230 05/25/18 0300 05/25/18 0340  BP: (!) 172/108 (!) 148/93 133/86 131/81  Pulse: (!) 115 (!) 110 (!) 104 (!) 104  Resp: (!) 30 (!) 27 (!) 25 (!) 24  SpO2: 96% 99% 100% 98%  Weight:      Height:          Constitutional: Tachypneic, dyspneic with speech, no  pallor, no diaphoresis  Eyes: PERTLA, lids and conjunctivae normal ENMT: Mucous membranes are moist. Posterior pharynx clear of any exudate or lesions.   Neck: normal, supple, no masses, no thyromegaly Respiratory: Expiratory wheezes. Increased WOB. No pallor or cyanosis.  Cardiovascular: Rate ~110 and regular. No extremity edema. Abdomen: No distension, no tenderness, soft. Bowel sounds normal.  Musculoskeletal: no clubbing / cyanosis. No joint deformity upper and lower extremities.    Skin: no significant rashes, lesions, ulcers. Warm, dry, well-perfused. Neurologic: CN 2-12 grossly intact. Sensation intact. Strength 5/5 in all 4 limbs.  Psychiatric: Alert and oriented x 3. Pleasant, cooperative.     Labs on Admission: I have personally reviewed following labs and imaging studies  CBC: Recent Labs  Lab 05/20/18 0407 05/20/18 0852 05/21/18 0354 05/25/18 0055  WBC  --  9.5 20.7* 12.9*  NEUTROABS  --   --   --  7.0  HGB 16.7* 15.5* 15.7* 18.9*  HCT 49.0* 46.2* 46.1* 53.8*  MCV  --  91.1 90.6 91.5  PLT  --  186 182 191   Basic Metabolic Panel: Recent Labs  Lab 05/20/18 0407 05/20/18 0852 05/21/18 0354 05/25/18 0055  NA 137 138 138 138  K 3.2* 3.9 4.3 4.1  CL 103 103 108 100  CO2  --  14* 24 29  GLUCOSE 492* 508* 189* 259*  BUN 11 10 19 13   CREATININE 0.40* 0.74  0.79 0.49 0.50  CALCIUM  --  9.5 9.3 10.0   GFR: Estimated Creatinine Clearance: 82.3 mL/min (by C-G formula based on SCr of 0.5 mg/dL). Liver Function Tests: No results for input(s): AST, ALT, ALKPHOS, BILITOT, PROT, ALBUMIN in the last 168 hours. No results for input(s): LIPASE, AMYLASE in the last 168 hours. No results for input(s): AMMONIA in the last 168 hours. Coagulation Profile: No results for input(s): INR, PROTIME in the last 168 hours. Cardiac Enzymes: Recent Labs  Lab 05/25/18 0055  TROPONINI <0.03   BNP (last 3 results) No results for input(s): PROBNP in the last 8760 hours. HbA1C: No  results for input(s): HGBA1C in the last 72 hours. CBG: Recent Labs  Lab 05/20/18 2305 05/21/18 0737 05/21/18 1144 05/21/18 1735 05/25/18 0113  GLUCAP 193* 266* 278* 243* 258*   Lipid Profile: No results for input(s): CHOL, HDL, LDLCALC, TRIG, CHOLHDL, LDLDIRECT in the last 72 hours. Thyroid Function Tests: No results for input(s): TSH, T4TOTAL, FREET4, T3FREE, THYROIDAB in the last 72 hours. Anemia Panel: No results for input(s): VITAMINB12, FOLATE, FERRITIN, TIBC, IRON, RETICCTPCT in the last 72 hours. Urine analysis: No results found for: COLORURINE, APPEARANCEUR, LABSPEC, PHURINE, GLUCOSEU, HGBUR, BILIRUBINUR, KETONESUR, PROTEINUR, UROBILINOGEN, NITRITE, LEUKOCYTESUR Sepsis Labs: @LABRCNTIP (procalcitonin:4,lacticidven:4) )No results found for this or any previous visit (from the past 240 hour(s)).   Radiological Exams on Admission: Dg Chest Port 1 View  Result Date: 05/25/2018 CLINICAL DATA:  Shortness of breath EXAM: PORTABLE CHEST 1 VIEW COMPARISON:  Chest radiograph 05/20/2018 FINDINGS: The heart size and mediastinal contours are within normal limits. Both lungs are clear. The visualized skeletal structures are unremarkable. IMPRESSION: No active disease. Electronically Signed   By: Deatra Robinson M.D.   On: 05/25/2018 01:44    EKG: Independently reviewed. Sinus tachycardia (rate 114).   Assessment/Plan   1. Asthma exacerbation; acute hypercarbic respiratory failure  - Presents with SOB and wheezing, in distress on arrival   - CXR is clear; no evidence for VTE  - She has improved in ED with IV Solu-Medrol, magnesium, and BiPAP   - Continue BiPAP as needed, continue systemic steroids, give DuoNeb now, then albuterol nebs as needed    2. Insulin-dependent DM  - A1c was 12.4% earlier this month  - Managed at home with metformin and Novolin 70/30 30 units BID  - Check CBG's, start with Levemir 17 units BID and Novolog correctional, adjust as needed     DVT prophylaxis:  Lovenox Code Status: Full  Family Communication: Husband updated at bedside Consults called: ED physician discussed with PCCM  Admission status: Inpatient     Briscoe Deutscher, MD [facility] Pager [phone]  If 7PM-7AM, please contact night-coverage [email or web address] Password [email or web address]  05/25/2018, 4:13 AM

## 2018-05-25 NOTE — Progress Notes (Signed)
Patient is doing better , wheezes are less, patient appears with less wob. She most likely can get off BiPAP in a few hours.

## 2018-05-26 DIAGNOSIS — Z794 Long term (current) use of insulin: Secondary | ICD-10-CM

## 2018-05-26 DIAGNOSIS — E119 Type 2 diabetes mellitus without complications: Secondary | ICD-10-CM

## 2018-05-26 DIAGNOSIS — J9602 Acute respiratory failure with hypercapnia: Secondary | ICD-10-CM

## 2018-05-26 LAB — BLOOD GAS, ARTERIAL
Acid-Base Excess: 0.4 mmol/L (ref 0.0–2.0)
Bicarbonate: 24.7 mmol/L (ref 20.0–28.0)
DRAWN BY: 105551
FIO2: 21
O2 SAT: 91.3 %
pCO2 arterial: 39.1 mmHg (ref 32.0–48.0)
pH, Arterial: 7.413 (ref 7.350–7.450)
pO2, Arterial: 62.4 mmHg — ABNORMAL LOW (ref 83.0–108.0)

## 2018-05-26 LAB — GLUCOSE, CAPILLARY
GLUCOSE-CAPILLARY: 355 mg/dL — AB (ref 70–99)
Glucose-Capillary: 257 mg/dL — ABNORMAL HIGH (ref 70–99)
Glucose-Capillary: 244 mg/dL — ABNORMAL HIGH (ref 70–99)
Glucose-Capillary: 331 mg/dL — ABNORMAL HIGH (ref 70–99)

## 2018-05-26 LAB — BASIC METABOLIC PANEL
ANION GAP: 10 (ref 5–15)
BUN: 16 mg/dL (ref 6–20)
CALCIUM: 9.3 mg/dL (ref 8.9–10.3)
CO2: 24 mmol/L (ref 22–32)
CREATININE: 0.36 mg/dL — AB (ref 0.44–1.00)
Chloride: 102 mmol/L (ref 98–111)
GFR calc Af Amer: >60 mL/min (ref >60)
Glucose, Bld: 299 mg/dL — ABNORMAL HIGH (ref 70–99)
Potassium: 4.4 mmol/L (ref 3.5–5.1)
SODIUM: 136 mmol/L (ref 135–145)

## 2018-05-26 LAB — CBC
HCT: 45.8 % (ref 36.0–46.0)
Hemoglobin: 15.7 g/dL — ABNORMAL HIGH (ref 12.0–15.0)
MCH: 31.5 pg (ref 26.0–34.0)
MCHC: 34.3 g/dL (ref 30.0–36.0)
MCV: 92 fL (ref 78.0–100.0)
PLATELETS: 180 10*3/uL (ref 150–400)
RBC: 4.98 MIL/uL (ref 3.87–5.11)
RDW: 13.2 % (ref 11.5–15.5)
WBC: 16.9 10*3/uL — ABNORMAL HIGH (ref 4.0–10.5)

## 2018-05-26 LAB — MRSA PCR SCREENING: MRSA BY PCR: NEGATIVE

## 2018-05-26 MED ORDER — INSULIN ASPART PROT & ASPART (70-30 MIX) 100 UNIT/ML ~~LOC~~ SUSP
33.0000 [IU] | Freq: Two times a day (BID) | SUBCUTANEOUS | Status: DC
  Administered 2018-05-26 – 2018-05-27 (×2): 33 [IU] via SUBCUTANEOUS
  Filled 2018-05-26: qty 10

## 2018-05-26 NOTE — Progress Notes (Signed)
Patients appears in no distress still has decreased breath sounds with fine wheezes.Saturation running about 92 on room air while patient flat sleeping.

## 2018-05-26 NOTE — Progress Notes (Signed)
At around 1855, patient Oxygen Sat noted to be 89-91% on room air. Patient denied feeling any shortness of breath. Patient placed on oxygen via nasal cannula @ 1.5L. O2 sat now 94%.

## 2018-05-26 NOTE — Progress Notes (Signed)
PROGRESS NOTE    Margaret Santana  ZOX:[id number] DOB: [birth year] DOA: 05/25/2018 PCP: Patient, No Pcp Per     Brief Narrative:  47 year old woman with history of asthma recently discharged from the hospital 5 days prior to this hospitalization due to asthma exacerbation.  She states she was never sent home with medications.  She never quite returned to her baseline.  She returned to the hospital day of admission due to repeat shortness of breath.  She was found to have hypoxic and hypercarbic respiratory failure, was placed on BiPAP and admission was requested.   Assessment & Plan:   Principal Problem:   Asthma exacerbation Active Problems:   Insulin-requiring or dependent type II diabetes mellitus (HCC)   Acute hypercapnic respiratory failure (HCC)   Acute asthma exacerbation   Acute hypoxic and hypercapnic respiratory failure -Due to asthma with acute exacerbation. -Did require BiPAP transiently, has since been weaned with improving ABGs.  ABG this a.m. with a pH of 7.41 and a PCO2 of 39. -Continue systemic steroids, frequent nebs. -At home she is only on albuterol.  I believe she needs a step up in treatment to include inhaled steroids as well as inhaled long-acting broncho-dilators given her severe asthma that has required 2 hospitalizations in less than a week. -Stable for transfer to floor today as she has not used BiPAP in over 24 hours. -Upon discharge we will send home on Symbicort/Advair as well as as needed albuterol.  Insulin-dependent diabetes -A1c was 12.4 earlier this month.  As of 9/7 transitioned patient back to 7030 as she will be unable to afford long-acting insulin upon discharge. -CBGs remain elevated in the 200-300 range. -We will increase 7030 from 30 to 33 units twice daily daily.   DVT prophylaxis: Lovenox Code Status: Full code Family Communication: Husband at bedside updated on plan of care and all questions answered Disposition Plan: Transfer  to floor, would anticipate discharge home over next 24 to 48 hours  Consultants:   None  Procedures:   None  Antimicrobials:  Anti-infectives (From admission, onward)   None       Subjective: Lying in bed, states she is improved but still not close to her baseline, still short of breath, denies chest pain.  Objective: Vitals:   05/26/18 0600 05/26/18 0700 05/26/18 0800 05/26/18 0837  BP: 131/80 118/85 123/78   Pulse: 76 73 70   Resp: 20 18 18    Temp:   98.4 F (36.9 C)   TempSrc:   Oral   SpO2:  91% 92% 91%  Weight:      Height:        Intake/Output Summary (Last 24 hours) at 05/26/2018 1054 Last data filed at 05/26/2018 0906 Gross per 24 hour  Intake 6 ml  Output -  Net 6 ml   Filed Weights   05/25/18 0040 05/25/18 0529 05/26/18 0500  Weight: 73 kg 76.5 kg 76.1 kg    Examination:  General exam: Alert, awake, oriented x 3, morbidly obese Respiratory system: Bilateral expiratory wheezes, coarse breath sounds.  Respiratory effort normal. Cardiovascular system:RRR. No murmurs, rubs, gallops. Gastrointestinal system: Abdomen is obese, nondistended, soft and nontender. No organomegaly or masses felt. Normal bowel sounds heard. Central nervous system: Alert and oriented. No focal neurological deficits. Extremities: No C/C/E, +pedal pulses Skin: No rashes, lesions or ulcers Psychiatry: Judgement and insight appear normal. Mood & affect appropriate.     Data Reviewed: I have personally reviewed following labs and imaging studies  CBC:  Recent Labs  Lab 05/20/18 0407 05/20/18 0852 05/21/18 0354 05/25/18 0055 05/26/18 0441  WBC  --  9.5 20.7* 12.9* 16.9*  NEUTROABS  --   --   --  7.0  --   HGB 16.7* 15.5* 15.7* 18.9* 15.7*  HCT 49.0* 46.2* 46.1* 53.8* 45.8  MCV  --  91.1 90.6 91.5 92.0  PLT  --  186 182 191 180   Basic Metabolic Panel: Recent Labs  Lab 05/20/18 0407 05/20/18 0852 05/21/18 0354 05/25/18 0055 05/26/18 0441  NA 137 138 138 138 136  K  3.2* 3.9 4.3 4.1 4.4  CL 103 103 108 100 102  CO2  --  14* 24 29 24   GLUCOSE 492* 508* 189* 259* 299*  BUN 11 10 19 13 16   CREATININE 0.40* 0.74  0.79 0.49 0.50 0.36*  CALCIUM  --  9.5 9.3 10.0 9.3   GFR: Estimated Creatinine Clearance: 84 mL/min (A) (by C-G formula based on SCr of 0.36 mg/dL (L)). Liver Function Tests: No results for input(s): AST, ALT, ALKPHOS, BILITOT, PROT, ALBUMIN in the last 168 hours. No results for input(s): LIPASE, AMYLASE in the last 168 hours. No results for input(s): AMMONIA in the last 168 hours. Coagulation Profile: No results for input(s): INR, PROTIME in the last 168 hours. Cardiac Enzymes: Recent Labs  Lab 05/25/18 0055  TROPONINI <0.03   BNP (last 3 results) No results for input(s): PROBNP in the last 8760 hours. HbA1C: No results for input(s): HGBA1C in the last 72 hours. CBG: Recent Labs  Lab 05/25/18 0802 05/25/18 1209 05/25/18 1558 05/25/18 2105 05/26/18 0733  GLUCAP 298* 285* 255* 210* 355*   Lipid Profile: No results for input(s): CHOL, HDL, LDLCALC, TRIG, CHOLHDL, LDLDIRECT in the last 72 hours. Thyroid Function Tests: No results for input(s): TSH, T4TOTAL, FREET4, T3FREE, THYROIDAB in the last 72 hours. Anemia Panel: No results for input(s): VITAMINB12, FOLATE, FERRITIN, TIBC, IRON, RETICCTPCT in the last 72 hours. Urine analysis: No results found for: COLORURINE, APPEARANCEUR, LABSPEC, PHURINE, GLUCOSEU, HGBUR, BILIRUBINUR, KETONESUR, PROTEINUR, UROBILINOGEN, NITRITE, LEUKOCYTESUR Sepsis Labs: @LABRCNTIP (procalcitonin:4,lacticidven:4)  ) Recent Results (from the past 240 hour(s))  MRSA PCR Screening     Status: None   Collection Time: 05/25/18  5:26 AM  Result Value Ref Range Status   MRSA by PCR NEGATIVE NEGATIVE Final    Comment:        The GeneXpert MRSA Assay (FDA approved for NASAL specimens only), is one component of a comprehensive MRSA colonization surveillance program. It is not intended to diagnose  MRSA infection nor to guide or monitor treatment for MRSA infections. Performed at [facility], [address] [place], [place] [address]          Radiology Studies: Dg Chest [facility] 1 View  Result Date: 05/25/2018 CLINICAL DATA:  Asthma exacerbation. EXAM: PORTABLE CHEST 1 VIEW COMPARISON:  05/25/2018 FINDINGS: The heart size and mediastinal contours are within normal limits. Both lungs are clear. The visualized skeletal structures are unremarkable. IMPRESSION: No active disease. Electronically Signed   By: Myles Rosenthal M.D.   On: 05/25/2018 17:50   Dg Chest Port 1 View  Result Date: 05/25/2018 CLINICAL DATA:  Shortness of breath EXAM: PORTABLE CHEST 1 VIEW COMPARISON:  Chest radiograph 05/20/2018 FINDINGS: The heart size and mediastinal contours are within normal limits. Both lungs are clear. The visualized skeletal structures are unremarkable. IMPRESSION: No active disease. Electronically Signed   By: Deatra Robinson M.D.   On: 05/25/2018 01:44  Scheduled Meds: . budesonide (PULMICORT) nebulizer solution  0.5 mg Nebulization BID  . enoxaparin (LOVENOX) injection  40 mg Subcutaneous Q24H  . insulin aspart  0-15 Units Subcutaneous TID WC  . insulin aspart  0-5 Units Subcutaneous QHS  . insulin aspart protamine- aspart  30 Units Subcutaneous BID WC  . ipratropium-albuterol  3 mL Nebulization Q4H  . living well with diabetes book- in spanish   Does not apply Once  . loratadine  10 mg Oral Daily  . mouth rinse  15 mL Mouth Rinse q12n4p  . methylPREDNISolone (SOLU-MEDROL) injection  60 mg Intravenous Q6H  . sodium chloride flush  3 mL Intravenous Q12H  . sodium chloride flush  3 mL Intravenous Q12H   Continuous Infusions:   LOS: 1 day    Time spent: 35 minutes. Greater than 50% of this time was spent in direct contact with the patient and with patient's husband, coordinating care and discussing relevant ongoing clinical issues, including necessity of long-term maintenance  treatment for asthma to prevent recurrent hospitalizations.     Chaya Jan, MD [facility] Pager [phone]  If 7PM-7AM, please contact night-coverage [email or web address] Password [facility] 05/26/2018, 10:54 AM

## 2018-05-27 LAB — CBC
HEMATOCRIT: 43.9 % (ref 36.0–46.0)
Hemoglobin: 15.1 g/dL — ABNORMAL HIGH (ref 12.0–15.0)
MCH: 31.5 pg (ref 26.0–34.0)
MCHC: 34.4 g/dL (ref 30.0–36.0)
MCV: 91.6 fL (ref 78.0–100.0)
Platelets: 178 10*3/uL (ref 150–400)
RBC: 4.79 MIL/uL (ref 3.87–5.11)
RDW: 13.2 % (ref 11.5–15.5)
WBC: 16.7 10*3/uL — AB (ref 4.0–10.5)

## 2018-05-27 LAB — BASIC METABOLIC PANEL
Anion gap: 7 (ref 5–15)
BUN: 16 mg/dL (ref 6–20)
CO2: 26 mmol/L (ref 22–32)
Calcium: 9.1 mg/dL (ref 8.9–10.3)
Chloride: 104 mmol/L (ref 98–111)
Creatinine, Ser: 0.42 mg/dL — ABNORMAL LOW (ref 0.44–1.00)
GFR calc Af Amer: >60 mL/min (ref >60)
Glucose, Bld: 296 mg/dL — ABNORMAL HIGH (ref 70–99)
POTASSIUM: 4.2 mmol/L (ref 3.5–5.1)
SODIUM: 137 mmol/L (ref 135–145)

## 2018-05-27 LAB — GLUCOSE, CAPILLARY
GLUCOSE-CAPILLARY: 250 mg/dL — AB (ref 70–99)
Glucose-Capillary: 269 mg/dL — ABNORMAL HIGH (ref 70–99)
Glucose-Capillary: 258 mg/dL — ABNORMAL HIGH (ref 70–99)
Glucose-Capillary: 177 mg/dL — ABNORMAL HIGH (ref 70–99)

## 2018-05-27 MED ORDER — INSULIN ASPART PROT & ASPART (70-30 MIX) 100 UNIT/ML ~~LOC~~ SUSP
36.0000 [IU] | Freq: Two times a day (BID) | SUBCUTANEOUS | Status: DC
  Administered 2018-05-27 – 2018-05-28 (×2): 36 [IU] via SUBCUTANEOUS
  Filled 2018-05-27: qty 10

## 2018-05-27 MED ORDER — IPRATROPIUM-ALBUTEROL 0.5-2.5 (3) MG/3ML IN SOLN
3.0000 mL | Freq: Four times a day (QID) | RESPIRATORY_TRACT | Status: DC
  Administered 2018-05-28: 3 mL via RESPIRATORY_TRACT
  Filled 2018-05-27 (×2): qty 3

## 2018-05-27 NOTE — Progress Notes (Signed)
PROGRESS NOTE    Margaret Santana  HOO:[id number] DOB: 1971/06/28 DOA: 05/25/2018 PCP: Patient, No Pcp Per     Brief Narrative:  47 year old woman with history of asthma recently discharged from the hospital 5 days prior to this hospitalization due to asthma exacerbation.  She states she was never sent home with medications.  She never quite returned to her baseline.  She returned to the hospital day of admission due to repeat shortness of breath.  She was found to have hypoxic and hypercarbic respiratory failure, was placed on BiPAP and admission was requested.   Assessment & Plan:   Principal Problem:   Asthma exacerbation Active Problems:   Insulin-requiring or dependent type II diabetes mellitus (HCC)   Acute hypercapnic respiratory failure (HCC)   Acute asthma exacerbation   Acute hypoxic and hypercapnic respiratory failure -Due to asthma with acute exacerbation. -Did require BiPAP transiently, has since been weaned with improving ABGs.  ABG on 9/8 with a pH of 7.41 and a PCO2 of 39. -Continue systemic steroids, frequent nebs. -At home she is only on albuterol.  I believe she needs a step up in treatment to include inhaled steroids as well as inhaled long-acting broncho-dilators given her severe asthma that has required 2 hospitalizations in less than a week. -Will also need a steroid taper on DC. -Still with wheezing on lung auscultation today, altho much improved over past 24-48 hours. -Upon discharge we will send home on Symbicort/Advair as well as as needed albuterol. -Try to wean oxygen today to keep sats >90%.  Insulin-dependent diabetes -A1c was 12.4 earlier this month.  As of 9/7 transitioned patient back to 7030 as she will be unable to afford long-acting insulin upon discharge. -CBGs remain elevated in the 200-300 range. -We will increase 7030 from 33-36 units twice daily daily. -Continue to monitor CBGs.   DVT prophylaxis: Lovenox Code Status: Full  code Family Communication: Patient only Disposition Plan: Transfer to floor, would anticipate discharge home over next 24 hours  Consultants:   None  Procedures:   None  Antimicrobials:  Anti-infectives (From admission, onward)   None       Subjective: In bed, states improved. A little short-winded after performing her morning ablutions.  Objective: Vitals:   05/27/18 0517 05/27/18 0733 05/27/18 0828 05/27/18 0831  BP:      Pulse:  69    Resp:  (!) 21    Temp:  98 F (36.7 C)    TempSrc:  Oral    SpO2:  96% 98% 98%  Weight: 76.4 kg     Height:        Intake/Output Summary (Last 24 hours) at 05/27/2018 0930 Last data filed at 05/26/2018 2121 Gross per 24 hour  Intake 363 ml  Output -  Net 363 ml   Filed Weights   05/25/18 0529 05/26/18 0500 05/27/18 0517  Weight: 76.5 kg 76.1 kg 76.4 kg    Examination:  General exam: Alert, awake, oriented x 3, obese Respiratory system: Bilateral expiratory wheezing. Respiratory effort normal. Cardiovascular system:RRR. No murmurs, rubs, gallops. Gastrointestinal system: Abdomen is obese, nondistended, soft and nontender. No organomegaly or masses felt. Normal bowel sounds heard. Central nervous system: Alert and oriented. No focal neurological deficits. Extremities: No C/C/E, +pedal pulses Skin: No rashes, lesions or ulcers Psychiatry: Judgement and insight appear normal. Mood & affect appropriate.      Data Reviewed: I have personally reviewed following labs and imaging studies  CBC: Recent Labs  Lab 05/21/18 0354  05/25/18 0055 05/26/18 0441 05/27/18 0408  WBC 20.7* 12.9* 16.9* 16.7*  NEUTROABS  --  7.0  --   --   HGB 15.7* 18.9* 15.7* 15.1*  HCT 46.1* 53.8* 45.8 43.9  MCV 90.6 91.5 92.0 91.6  PLT 182 191 180 178   Basic Metabolic Panel: Recent Labs  Lab 05/21/18 0354 05/25/18 0055 05/26/18 0441 05/27/18 0408  NA 138 138 136 137  K 4.3 4.1 4.4 4.2  CL 108 100 102 104  CO2 24 29 24 26   GLUCOSE 189*  259* 299* 296*  BUN 19 13 16 16   CREATININE 0.49 0.50 0.36* 0.42*  CALCIUM 9.3 10.0 9.3 9.1   GFR: Estimated Creatinine Clearance: 84.2 mL/min (A) (by C-G formula based on SCr of 0.42 mg/dL (L)). Liver Function Tests: No results for input(s): AST, ALT, ALKPHOS, BILITOT, PROT, ALBUMIN in the last 168 hours. No results for input(s): LIPASE, AMYLASE in the last 168 hours. No results for input(s): AMMONIA in the last 168 hours. Coagulation Profile: No results for input(s): INR, PROTIME in the last 168 hours. Cardiac Enzymes: Recent Labs  Lab 05/25/18 0055  TROPONINI <0.03   BNP (last 3 results) No results for input(s): PROBNP in the last 8760 hours. HbA1C: No results for input(s): HGBA1C in the last 72 hours. CBG: Recent Labs  Lab 05/26/18 0733 05/26/18 1140 05/26/18 1630 05/26/18 2116 05/27/18 0732  GLUCAP 355* 257* 244* 331* 269*   Lipid Profile: No results for input(s): CHOL, HDL, LDLCALC, TRIG, CHOLHDL, LDLDIRECT in the last 72 hours. Thyroid Function Tests: No results for input(s): TSH, T4TOTAL, FREET4, T3FREE, THYROIDAB in the last 72 hours. Anemia Panel: No results for input(s): VITAMINB12, FOLATE, FERRITIN, TIBC, IRON, RETICCTPCT in the last 72 hours. Urine analysis: No results found for: COLORURINE, APPEARANCEUR, LABSPEC, PHURINE, GLUCOSEU, HGBUR, BILIRUBINUR, KETONESUR, PROTEINUR, UROBILINOGEN, NITRITE, LEUKOCYTESUR Sepsis Labs: @LABRCNTIP (procalcitonin:4,lacticidven:4)  ) Recent Results (from the past 240 hour(s))  MRSA PCR Screening     Status: None   Collection Time: 05/25/18  5:26 AM  Result Value Ref Range Status   MRSA by PCR NEGATIVE NEGATIVE Final    Comment:        The GeneXpert MRSA Assay (FDA approved for NASAL specimens only), is one component of a comprehensive MRSA colonization surveillance program. It is not intended to diagnose MRSA infection nor to guide or monitor treatment for MRSA infections. Performed at [facility], [address], [place], [place] [address]          Radiology Studies: Dg Chest [facility] 1 View  Result Date: 05/25/2018 CLINICAL DATA:  Asthma exacerbation. EXAM: PORTABLE CHEST 1 VIEW COMPARISON:  05/25/2018 FINDINGS: The heart size and mediastinal contours are within normal limits. Both lungs are clear. The visualized skeletal structures are unremarkable. IMPRESSION: No active disease. Electronically Signed   By: Myles Rosenthal M.D.   On: 05/25/2018 17:50        Scheduled Meds: . budesonide (PULMICORT) nebulizer solution  0.5 mg Nebulization BID  . enoxaparin (LOVENOX) injection  40 mg Subcutaneous Q24H  . insulin aspart  0-15 Units Subcutaneous TID WC  . insulin aspart  0-5 Units Subcutaneous QHS  . insulin aspart protamine- aspart  33 Units Subcutaneous BID WC  . ipratropium-albuterol  3 mL Nebulization Q4H  . living well with diabetes book- in spanish   Does not apply Once  . loratadine  10 mg Oral Daily  . mouth rinse  15 mL Mouth Rinse q12n4p  . methylPREDNISolone (SOLU-MEDROL) injection  60  mg Intravenous Q6H  . sodium chloride flush  3 mL Intravenous Q12H  . sodium chloride flush  3 mL Intravenous Q12H   Continuous Infusions:   LOS: 2 days    Time spent: 25 minutes.     Chaya Jan, MD [facility] Pager [phone]  If 7PM-7AM, please contact night-coverage [email or web address] Password [email or web address] 05/27/2018, 9:30 AM

## 2018-05-27 NOTE — Progress Notes (Signed)
Inpatient Diabetes Program Recommendations  AACE/ADA: New Consensus Statement on Inpatient Glycemic Control (2019)  Target Ranges:  Prepandial:   less than 140 mg/dL      Peak postprandial:   less than 180 mg/dL (1-2 hours)      Critically ill patients:  140 - 180 mg/dL   Results for RIVAS ISRAEL, CULL (MRN [id number]) as of 05/27/2018 10:30  Ref. Range 05/26/2018 07:33 05/26/2018 11:40 05/26/2018 16:30 05/26/2018 21:16 05/27/2018 07:32  Glucose-Capillary Latest Ref Range: 70 - 99 mg/dL 315 (H) 945 (H) 859 (H) 331 (H) 269 (H)   Review of Glycemic Control  Diabetes history: DM2 (newly dx during last hospitalization 9/2 to 05/21/18)  Outpatient Diabetes medications: 70/30 30 units BID, Metformin 500 mg BID Current orders for Inpatient glycemic control: 70/30 36 units BID, Novolog 0-15 units TID with meals, Novolog 0-5 units QHS; Solumedrol 60 mg Q6H  Inpatient Diabetes Program Recommendations:  Insulin - Basal: Noted 70/30 increased to 36 units BID today. HgbA1C: A1C 12.4% on 05/20/18. Patient newly dx with DM during last hospitalization and was discharged new to insulin (70/30). Patient was seen by Inpatient Diabetes Coordinator on 05/21/18 (see note for details) during last hospital admission.  Thanks, Orlando Penner, RN, MSN, CDE Diabetes Coordinator Inpatient Diabetes Program [phone] (Team Pager from 8am to 5pm)

## 2018-05-27 NOTE — Care Management Note (Signed)
Case Management Note  Patient Details  Name: Margaret Santana MRN: [id number] Date of Birth: [birth year]  Subjective/Objective:  Asthma exacerbation. From home, independent. Recent admission for same. Discharged with follow up with [facility] (appt is scheduled for 05/29/2018 at 8:50). Patient was instructed last admission to also obtain medications through [facility] pharmacy. Viewed CM notes in Epic, patient returned to ED, confused about medications, interpretor was used to explain to patient again to use [facility] pharmacy.    Patient still states she never received prescriptions.                 Action/Plan: DC home. Patient has f/u appt scheduled. Will provide MATCH if needed.   Expected Discharge Date:  05/27/18               Expected Discharge Plan:  Home/Self Care  In-House Referral:     Discharge planning Services  CM Consult, Indigent [facility] Clinic  Post Acute Care Choice:    Choice offered to:     DME Arranged:    DME Agency:     HH Arranged:    HH Agency:     Status of Service:  Completed, signed off  If discussed at [employer], dates discussed:    Additional Comments:  Minnah Llamas, Chrystine Oiler, RN 05/27/2018, 11:41 AM

## 2018-05-28 DIAGNOSIS — J9601 Acute respiratory failure with hypoxia: Secondary | ICD-10-CM

## 2018-05-28 LAB — GLUCOSE, CAPILLARY
GLUCOSE-CAPILLARY: 271 mg/dL — AB (ref 70–99)
Glucose-Capillary: 182 mg/dL — ABNORMAL HIGH (ref 70–99)

## 2018-05-28 MED ORDER — PREDNISONE 10 MG PO TABS: 10.0000 mg | ORAL_TABLET | Freq: Every day | ORAL | 0 refills | Status: DC

## 2018-05-28 MED ORDER — "INSULIN SYRINGE 28G X 1/2"" 1 ML MISC": 11 refills | Status: DC

## 2018-05-28 MED ORDER — INSULIN ASPART PROT & ASPART (70-30 MIX) 100 UNIT/ML ~~LOC~~ SUSP: 36.0000 [IU] | Freq: Two times a day (BID) | SUBCUTANEOUS | 11 refills | Status: DC

## 2018-05-28 MED ORDER — ALBUTEROL SULFATE HFA 108 (90 BASE) MCG/ACT IN AERS: 2.0000 | INHALATION_SPRAY | RESPIRATORY_TRACT | 5 refills | Status: DC | PRN

## 2018-05-28 MED ORDER — BUDESONIDE-FORMOTEROL FUMARATE 160-4.5 MCG/ACT IN AERO: 2.0000 | INHALATION_SPRAY | Freq: Two times a day (BID) | RESPIRATORY_TRACT | 5 refills | Status: DC

## 2018-05-28 NOTE — Discharge Summary (Addendum)
Physician Discharge Summary  Margaret Santana OZH:[id number] DOB: 16-Jan-1971 DOA: 05/25/2018  PCP: Patient, No Pcp Per  Admit date: 05/25/2018 Discharge date: 05/28/2018  Time spent: 45 minutes  Recommendations for Outpatient Follow-up:  -To be discharged home today. -We will provide prescriptions for inhalers and prednisone taper. -She has appointment scheduled with a community health and wellness clinic in [place] tomorrow 9/11.  Discharge Diagnoses:  Principal Problem:   Asthma exacerbation Active Problems:   Insulin-requiring or dependent type II diabetes mellitus (HCC)   Acute hypercapnic respiratory failure (HCC)   Acute asthma exacerbation   Discharge Condition: Stable and improved  Filed Weights   05/25/18 0529 05/26/18 0500 05/27/18 0517  Weight: 76.5 kg 76.1 kg 76.4 kg    History of present illness:  As per Dr. Antionette Char on 9/7: Margaret Santana is a 47 y.o. female with medical history significant for asthma, never requiring intubation, and now presenting with worsening shortness of breath and wheezing.  Patient was admitted to the hospital with acute asthma exacerbation 1 week ago, improved significantly with treatment and was discharged home with prednisone taper on 05/21/2018, reports that she never quite returned to her baseline, but was able to resume her usual activities until yesterday when she began to worsen.  Since that time, she has had increasing shortness of breath and wheezing, became significantly short of breath even at rest yesterday evening, and eventually came into the ED for evaluation.  She denies any chest pain, fevers, hemoptysis, history of DVT or PE, or lower extremity tenderness.  She reports an occasional cough productive of scant clear sputum.  ED Course: Upon arrival to the ED, patient is found to be in acute respiratory distress with mild tachycardia and stable blood pressure.  EKG features sinus tachycardia with rate 114 and chest  x-ray is negative for acute cardiopulmonary disease.  Chemistry panel is notable for serum glucose of 259 and CBC features a leukocytosis to 12,900 and a mild polycythemia.  Lactic acid is reassuringly normal, troponin is undetectable, and BNP is normal.  Patient was given 2 L of lactated Ringer's, 125 mg IV Solu-Medrol, 2 g IV magnesium, Versed, and was placed on BiPAP.  She reports subjective improvement with treatment in the ED, work of breathing and respiratory rate have improved, and hypercapnia has resolved.  She remains dyspneic and tachypneic at rest, however, has been continued on BiPAP, and will require admission for ongoing evaluation and management.  Hospital Course:   Acute hypoxic and hypercapnic respiratory failure -Due to asthma with acute exacerbation. -Did require BiPAP transiently, has since been weaned with improving ABGs.  ABG on 9/8 with a pH of 7.41 and a PCO2 of 39. -Continue systemic steroids, frequent nebs. -At home she is only on albuterol.  I believe she needs a step up in treatment to include inhaled steroids as well as inhaled long-acting broncho-dilators given her severe asthma that has required 2 hospitalizations in less than a week. -Will also need a steroid taper on DC. -She is doing well, has been completely weaned off oxygen with sats above 91%. -Will discharge  home today on Symbicort/Advair as well as as needed albuterol. -Has appointment scheduled with the [place] and wellness clinic for tomorrow 9/11.  Insulin-dependent diabetes -A1c was 12.4 earlier this month.  As of 9/7 transitioned patient back to 7030 as she will be unable to afford long-acting insulin upon discharge. -CBGs with significant improvement on current dose of 70 3036 units twice daily,  will discharge on this dose. -Will need continued outpatient diabetic monitoring for further insulin dose adjustment and continue diet education.  Procedures:  None    Consultations:  None  Discharge Instructions  Discharge Instructions    Diet - low sodium heart healthy   Complete by:  As directed    Increase activity slowly   Complete by:  As directed      Allergies as of 05/28/2018   No Known Allergies     Medication List    STOP taking these medications   freestyle lancets   Insulin Isophane & Regular Human (70-30) 100 UNIT/ML PEN Commonly known as:  HUMULIN 70/30 MIX   Pen Needles 3/16" 31G X 5 MM Misc   THEO-24 PO     TAKE these medications   albuterol 108 (90 Base) MCG/ACT inhaler Commonly known as:  PROVENTIL HFA;VENTOLIN HFA Inhale 1-2 puffs into the lungs every 6 (six) hours as needed for wheezing or shortness of breath. What changed:  Another medication with the same name was added. Make sure you understand how and when to take each.   albuterol 108 (90 Base) MCG/ACT inhaler Commonly known as:  PROVENTIL HFA;VENTOLIN HFA Inhale 2 puffs into the lungs every 4 (four) hours as needed for wheezing or shortness of breath. What changed:  You were already taking a medication with the same name, and this prescription was added. Make sure you understand how and when to take each.   budesonide-formoterol 160-4.5 MCG/ACT inhaler Commonly known as:  SYMBICORT Inhale 2 puffs into the lungs 2 (two) times daily.   insulin aspart protamine- aspart (70-30) 100 UNIT/ML injection Commonly known as:  NOVOLOG MIX 70/30 Inject 0.36 mLs (36 Units total) into the skin 2 (two) times daily with a meal.   INSULIN SYRINGE 1CC/28G 28G X 1/2" 1 ML Misc Use with insulin BID.   loratadine 10 MG tablet Commonly known as:  CLARITIN Take 10 mg by mouth daily.   metFORMIN 500 MG tablet Commonly known as:  GLUCOPHAGE Take 1 tablet (500 mg total) by mouth 2 (two) times daily with a meal.   predniSONE 10 MG tablet Commonly known as:  DELTASONE Take 1 tablet (10 mg total) by mouth daily with breakfast. Take 6 tablets today and then decrease by 1  tablet daily until none are left. What changed:    how much to take  how to take this  when to take this  additional instructions      No Known Allergies    The results of significant diagnostics from this hospitalization (including imaging, microbiology, ancillary and laboratory) are listed below for reference.    Significant Diagnostic Studies: Dg Chest 2 View  Result Date: 05/20/2018 CLINICAL DATA:  47 year old female with shortness of breath. EXAM: CHEST - 2 VIEW COMPARISON:  None. FINDINGS: The heart size and mediastinal contours are within normal limits. Both lungs are clear. The visualized skeletal structures are unremarkable. IMPRESSION: No active cardiopulmonary disease. Electronically Signed   By: Elgie Collard M.D.   On: 05/20/2018 00:41   Dg Chest Port 1 View  Result Date: 05/25/2018 CLINICAL DATA:  Asthma exacerbation. EXAM: PORTABLE CHEST 1 VIEW COMPARISON:  05/25/2018 FINDINGS: The heart size and mediastinal contours are within normal limits. Both lungs are clear. The visualized skeletal structures are unremarkable. IMPRESSION: No active disease. Electronically Signed   By: Myles Rosenthal M.D.   On: 05/25/2018 17:50   Dg Chest Port 1 View  Result Date: 05/25/2018 CLINICAL DATA:  Shortness of breath EXAM: PORTABLE CHEST 1 VIEW COMPARISON:  Chest radiograph 05/20/2018 FINDINGS: The heart size and mediastinal contours are within normal limits. Both lungs are clear. The visualized skeletal structures are unremarkable. IMPRESSION: No active disease. Electronically Signed   By: Deatra Robinson M.D.   On: 05/25/2018 01:44    Microbiology: Recent Results (from the past 240 hour(s))  MRSA PCR Screening     Status: None   Collection Time: 05/25/18  5:26 AM  Result Value Ref Range Status   MRSA by PCR NEGATIVE NEGATIVE Final    Comment:        The GeneXpert MRSA Assay (FDA approved for NASAL specimens only), is one component of a comprehensive MRSA colonization surveillance  program. It is not intended to diagnose MRSA infection nor to guide or monitor treatment for MRSA infections. Performed at [facility], [address] [place], [place] [address]      Labs: Basic Metabolic Panel: Recent Labs  Lab 05/25/18 0055 05/26/18 0441 05/27/18 0408  NA 138 136 137  K 4.1 4.4 4.2  CL 100 102 104  CO2 29 24 26   GLUCOSE 259* 299* 296*  BUN 13 16 16   CREATININE 0.50 0.36* 0.42*  CALCIUM 10.0 9.3 9.1   Liver Function Tests: No results for input(s): AST, ALT, ALKPHOS, BILITOT, PROT, ALBUMIN in the last 168 hours. No results for input(s): LIPASE, AMYLASE in the last 168 hours. No results for input(s): AMMONIA in the last 168 hours. CBC: Recent Labs  Lab 05/25/18 0055 05/26/18 0441 05/27/18 0408  WBC 12.9* 16.9* 16.7*  NEUTROABS 7.0  --   --   HGB 18.9* 15.7* 15.1*  HCT 53.8* 45.8 43.9  MCV 91.5 92.0 91.6  PLT 191 180 178   Cardiac Enzymes: Recent Labs  Lab 05/25/18 0055  TROPONINI <0.03   BNP: BNP (last 3 results) Recent Labs    05/25/18 0055  BNP 33.0    ProBNP (last 3 results) No results for input(s): PROBNP in the last 8760 hours.  CBG: Recent Labs  Lab 05/27/18 1142 05/27/18 1628 05/27/18 2120 05/28/18 0748 05/28/18 1124  GLUCAP 250* 258* 177* 182* 271*       Signed:  Chaya Jan  [facility] Pager: [phone] 05/28/2018, 11:44 AM

## 2018-05-28 NOTE — Progress Notes (Signed)
Discharge instructions reviewed with patient via interpreter service. Patient verbalized understanding of instructions. Patient discharged home in stable condition.

## 2018-05-28 NOTE — Care Management Note (Signed)
Case Management Note  Patient Details  Name: Margaret Santana MRN: [id number] Date of Birth: May 23, 1971  Expected Discharge Date:  05/28/18               Expected Discharge Plan:  Home/Self Care  In-House Referral:  NA  Discharge planning Services  CM Consult, Indigent Health Clinic, [facility]  Post Acute Care Choice:  NA Choice offered to:  NA  Status of Service:  Completed, signed off  If discussed at Long Length of Stay Meetings, dates discussed:    Additional Comments: DC home today with self care. F/U with [facility] scheduled for tomorrow. MATCH provided so pt may get medications today prior to appointment tomorrow.   Malcolm Metro, RN 05/28/2018, 12:48 PM

## 2018-05-29 ENCOUNTER — Inpatient Hospital Stay: Payer: Self-pay

## 2018-05-29 NOTE — Progress Notes (Deleted)
Patient ID: Margaret Santana, female   DOB: 08/24/1971, 47 y.o.   MRN: [id number]  After hospitalization 9/7-9/06/2018   From discharge summary: [name] [facility] a 47 y.o.femalewith medical history significant forasthma, never requiring intubation, and now presenting with worsening shortness of breath and wheezing. Patient was admitted to the hospital with acute asthma exacerbation 1 week ago,improved significantly with treatment and was discharged home with prednisone taper on 05/21/2018, reports that she never quite returned to her baseline, but was able to resume her usual activities until yesterday when she began to worsen. Since that time, she has had increasing shortness of breath and wheezing, became significantly short of breath even at rest yesterday evening, and eventually came into the ED for evaluation. She denies any chest pain, fevers, hemoptysis, history of DVT or PE, or lower extremity tenderness.She reports an occasional cough productive of scant clear sputum.  ED Course:Upon arrival to the ED, patient is found to be in acute respiratory distress with mild tachycardia and stable blood pressure. EKG features sinus tachycardia with rate 114 and chest x-ray is negative for acute cardiopulmonary disease. Chemistry panel is notable for serum glucose of 259 and CBC features a leukocytosis to 12,900 and a mild polycythemia. Lactic acid is reassuringly normal, troponin is undetectable, and BNP is normal. Patient was given 2 L of lactated Ringer's, 125 mg IV Solu-Medrol, 2 g IV magnesium, Versed, and was placed on BiPAP. She reports subjective improvement with treatment in the ED, work of breathing and respiratory rate have improved, and hypercapnia has resolved. She remains dyspneic and tachypneic at rest, however, has been continued on BiPAP, and will require admission for ongoing evaluation and management.  Hospital Course:   Acute hypoxic and hypercapnic  respiratory failure -Due to asthma with acute exacerbation. -Did require BiPAP transiently, has since been weaned with improving ABGs. ABG on 9/8with a pH of 7.41 and a PCO2 of 39. -Continue systemic steroids, frequent nebs. -At home she is only on albuterol. I believe she needs a step up in treatment to include inhaled steroids as well as inhaled long-acting broncho-dilators given her severe asthma that has required 2 hospitalizations in less than a week. -Will also need a steroid taper on DC. -She is doing well, has been completely weaned off oxygen with sats above 91%. -Will discharge  home today on Symbicort/Advair as well as as needed albuterol. -Has appointment scheduled with the [place] and wellness clinic for tomorrow 9/11.  Insulin-dependent diabetes -A1c was 12.4 earlier this month. As of 9/7 transitioned patient back to 7030 as she will be unable to afford long-acting insulin upon discharge. -CBGs with significant improvement on current dose of 70 3036 units twice daily, will discharge on this dose. -Will need continued outpatient diabetic monitoring for further insulin dose adjustment and continue diet education.

## 2019-01-13 ENCOUNTER — Emergency Department (HOSPITAL_COMMUNITY): Payer: Self-pay

## 2019-01-13 ENCOUNTER — Emergency Department (HOSPITAL_COMMUNITY)
Admission: EM | Admit: 2019-01-13 | Discharge: 2019-01-13 | Disposition: A | Discharge status: Home / Self Care | Payer: Self-pay | Attending: Emergency Medicine | Admitting: Emergency Medicine

## 2019-01-13 ENCOUNTER — Encounter (HOSPITAL_COMMUNITY): Payer: Self-pay

## 2019-01-13 ENCOUNTER — Other Ambulatory Visit: Payer: Self-pay

## 2019-01-13 DIAGNOSIS — E119 Type 2 diabetes mellitus without complications: Secondary | ICD-10-CM | POA: Insufficient documentation

## 2019-01-13 DIAGNOSIS — Z79899 Other long term (current) drug therapy: Secondary | ICD-10-CM | POA: Insufficient documentation

## 2019-01-13 DIAGNOSIS — J4541 Moderate persistent asthma with (acute) exacerbation: Secondary | ICD-10-CM | POA: Insufficient documentation

## 2019-01-13 DIAGNOSIS — Z794 Long term (current) use of insulin: Secondary | ICD-10-CM | POA: Insufficient documentation

## 2019-01-13 HISTORY — DX: Type 2 diabetes mellitus without complications: E11.9

## 2019-01-13 HISTORY — DX: Unspecified asthma, uncomplicated: J45.909

## 2019-01-13 LAB — BASIC METABOLIC PANEL
Anion gap: 11 (ref 5–15)
BUN: 13 mg/dL (ref 6–20)
CO2: 25 mmol/L (ref 22–32)
Calcium: 9.6 mg/dL (ref 8.9–10.3)
Chloride: 101 mmol/L (ref 98–111)
Creatinine, Ser: 0.51 mg/dL (ref 0.44–1.00)
GFR calc Af Amer: >60 mL/min (ref >60)
GFR calc non Af Amer: >60 mL/min (ref >60)
Glucose, Bld: 311 mg/dL — ABNORMAL HIGH (ref 70–99)
Potassium: 3.9 mmol/L (ref 3.5–5.1)
Sodium: 137 mmol/L (ref 135–145)

## 2019-01-13 LAB — CBC WITH DIFFERENTIAL/PLATELET
Abs Immature Granulocytes: 0.03 10*3/uL (ref 0.00–0.07)
Basophils Absolute: 0.1 10*3/uL (ref 0.0–0.1)
Basophils Relative: 2 %
Eosinophils Absolute: 0.9 10*3/uL — ABNORMAL HIGH (ref 0.0–0.5)
Eosinophils Relative: 10 %
HCT: 47.2 % — ABNORMAL HIGH (ref 36.0–46.0)
Hemoglobin: 16 g/dL — ABNORMAL HIGH (ref 12.0–15.0)
Immature Granulocytes: 0 %
Lymphocytes Relative: 27 %
Lymphs Abs: 2.5 10*3/uL (ref 0.7–4.0)
MCH: 30.4 pg (ref 26.0–34.0)
MCHC: 33.9 g/dL (ref 30.0–36.0)
MCV: 89.7 fL (ref 80.0–100.0)
Monocytes Absolute: 0.5 10*3/uL (ref 0.1–1.0)
Monocytes Relative: 6 %
Neutro Abs: 5 10*3/uL (ref 1.7–7.7)
Neutrophils Relative %: 55 %
Platelets: 192 10*3/uL (ref 150–400)
RBC: 5.26 MIL/uL — ABNORMAL HIGH (ref 3.87–5.11)
RDW: 12 % (ref 11.5–15.5)
WBC: 9.1 10*3/uL (ref 4.0–10.5)
nRBC: 0 % (ref 0.0–0.2)

## 2019-01-13 LAB — I-STAT BETA HCG BLOOD, ED (MC, WL, AP ONLY): I-stat hCG, quantitative: <5 m[IU]/mL (ref <5)

## 2019-01-13 LAB — TROPONIN I: Troponin I: <0.03 ng/mL (ref <0.03)

## 2019-01-13 MED ORDER — METHYLPREDNISOLONE SODIUM SUCC 125 MG IJ SOLR
125.0000 mg | Freq: Once | INTRAMUSCULAR | Status: AC
  Administered 2019-01-13: 21:00:00 125 mg via INTRAVENOUS
  Filled 2019-01-13: qty 2

## 2019-01-13 MED ORDER — MAGNESIUM SULFATE 2 GM/50ML IV SOLN
2.0000 g | Freq: Once | INTRAVENOUS | Status: AC
  Administered 2019-01-13: 2 g via INTRAVENOUS
  Filled 2019-01-13: qty 50

## 2019-01-13 MED ORDER — SODIUM CHLORIDE 0.9 % IV BOLUS
1000.0000 mL | Freq: Once | INTRAVENOUS | Status: AC
  Administered 2019-01-13: 1000 mL via INTRAVENOUS

## 2019-01-13 MED ORDER — AEROCHAMBER PLUS FLO-VU MISC
1.0000 | Freq: Once | Status: AC
  Administered 2019-01-13: 1
  Filled 2019-01-13: qty 1

## 2019-01-13 MED ORDER — PREDNISONE 20 MG PO TABS: ORAL_TABLET | ORAL | 0 refills | Status: DC

## 2019-01-13 MED ORDER — IPRATROPIUM BROMIDE HFA 17 MCG/ACT IN AERS
2.0000 | INHALATION_SPRAY | Freq: Once | RESPIRATORY_TRACT | Status: AC
  Administered 2019-01-13: 2 via RESPIRATORY_TRACT
  Filled 2019-01-13: qty 12.9

## 2019-01-13 MED ORDER — ALBUTEROL SULFATE HFA 108 (90 BASE) MCG/ACT IN AERS
8.0000 | INHALATION_SPRAY | Freq: Once | RESPIRATORY_TRACT | Status: AC
  Administered 2019-01-13: 8 via RESPIRATORY_TRACT
  Filled 2019-01-13: qty 6.7

## 2019-01-13 NOTE — ED Provider Notes (Signed)
[facility] EMERGENCY DEPARTMENT Provider Note   CSN: [id number] Arrival date & time: 01/13/19  1920    History   Chief Complaint Chief Complaint  Patient presents with  . Shortness of Breath    HPI Margaret Santana is a 48 y.o. female.     HPI  48 year old female presents with shortness of breath and cough.  History taken with assistance of Spanish interpreter.  For 3 days she is had cough with green sputum and dyspnea.  Feels like prior asthma.  She has chest pain when coughing and thinks is from the coughing diffusely across her chest.  No fevers or contact with someone with COVID-19.  No leg swelling or recent travel.  She is tried some tea and other herbal remedies for her asthma without relief.  Her glucose has been running in the 200s.  Past Medical History:  Diagnosis Date  . Asthma   . Diabetes mellitus without complication [facility])     Patient Active Problem List   Diagnosis Date Noted  . Insulin-requiring or dependent type II diabetes mellitus (HCC) 05/25/2018  . Acute hypercapnic respiratory failure (HCC) 05/25/2018  . Acute asthma exacerbation 05/25/2018  . Asthma exacerbation 05/20/2018  . Hyperglycemia 05/20/2018  . Obesity 05/20/2018    Past Surgical History:  Procedure Laterality Date  . CESAREAN SECTION       OB History   No obstetric history on file.      Home Medications    Prior to Admission medications   Medication Sig Start Date End Date Taking? Authorizing Provider  albuterol (PROVENTIL HFA;VENTOLIN HFA) 108 (90 Base) MCG/ACT inhaler Inhale 1-2 puffs into the lungs every 6 (six) hours as needed for wheezing or shortness of breath. 05/22/18   Hoy Register, MD  albuterol (PROVENTIL HFA;VENTOLIN HFA) 108 (90 Base) MCG/ACT inhaler Inhale 2 puffs into the lungs every 4 (four) hours as needed for wheezing or shortness of breath. 05/28/18   Philip Aspen, Limmie Patricia, MD  budesonide-formoterol [facility]) 160-4.5 MCG/ACT inhaler Inhale 2  puffs into the lungs 2 (two) times daily. 05/28/18   Philip Aspen, Limmie Patricia, MD  insulin aspart protamine- aspart (NOVOLOG MIX 70/30) (70-30) 100 UNIT/ML injection Inject 0.36 mLs (36 Units total) into the skin 2 (two) times daily with a meal. 05/28/18   Philip Aspen, Limmie Patricia, MD  Insulin Syringe-Needle U-100 (INSULIN SYRINGE 1CC/28G) 28G X 1/2" 1 ML MISC Use with insulin BID. 05/28/18   Philip Aspen, Limmie Patricia, MD  loratadine (CLARITIN) 10 MG tablet Take 10 mg by mouth daily.    [provider]  metFORMIN (GLUCOPHAGE) 500 MG tablet Take 1 tablet (500 mg total) by mouth 2 (two) times daily with a meal. 05/21/18   Elgergawy, Leana Roe, MD  predniSONE (DELTASONE) 20 MG tablet 2 tabs po daily x 4 days 01/14/19   Pricilla Loveless, MD    Family History Family History  Problem Relation Age of Onset  . Asthma Mother     Social History Social History   Tobacco Use  . Smoking status: Never Smoker  . Smokeless tobacco: Never Used  Substance Use Topics  . Alcohol use: Never    Frequency: Never  . Drug use: Never     Allergies   Patient has no known allergies.   Review of Systems Review of Systems  Constitutional: Negative for fever.  HENT: Negative for sore throat.   Respiratory: Positive for cough and shortness of breath.   Cardiovascular: Positive for chest pain. Negative for  leg swelling.  All other systems reviewed and are negative.    Physical Exam Updated Vital Signs BP 125/73   Pulse (!) 102   Temp 98.3 F (36.8 C) (Oral)   Resp (!) 22   Ht 5' (1.524 m)   Wt 74 kg   LMP 12/11/2018   SpO2 93%   BMI 31.86 kg/m   Physical Exam Vitals signs and nursing note reviewed.  Constitutional:      General: She is not in acute distress.    Appearance: She is well-developed. She is not ill-appearing or diaphoretic.  HENT:     Head: Normocephalic and atraumatic.     Right Ear: External ear normal.     Left Ear: External ear normal.     Nose: Nose normal.   Eyes:     General:        Right eye: No discharge.        Left eye: No discharge.  Cardiovascular:     Rate and Rhythm: Regular rhythm. Tachycardia present.     Heart sounds: Normal heart sounds.     Comments: HR low 100s Pulmonary:     Effort: Pulmonary effort is normal. Tachypnea present. No accessory muscle usage or respiratory distress.     Breath sounds: Wheezing (diffuse, expiratory) present.     Comments: Speaks in complete sentences Abdominal:     Palpations: Abdomen is soft.     Tenderness: There is no abdominal tenderness.  Musculoskeletal:     Right lower leg: No edema.     Left lower leg: No edema.  Skin:    General: Skin is warm and dry.  Neurological:     Mental Status: She is alert.  Psychiatric:        Mood and Affect: Mood is not anxious.      ED Treatments / Results  Labs (all labs ordered are listed, but only abnormal results are displayed) Labs Reviewed  BASIC METABOLIC PANEL - Abnormal; Notable for the following components:      Result Value   Glucose, Bld 311 (*)    All other components within normal limits  CBC WITH DIFFERENTIAL/PLATELET - Abnormal; Notable for the following components:   RBC 5.26 (*)    Hemoglobin 16.0 (*)    HCT 47.2 (*)    Eosinophils Absolute 0.9 (*)    All other components within normal limits  TROPONIN I  I-STAT BETA HCG BLOOD, ED ([facility], [facility], [facility] ONLY)    EKG EKG Interpretation  Date/Time:  Monday January 13 2019 20:37:44 EDT Ventricular Rate:  99 PR Interval:    QRS Duration: 110 QT Interval:  367 QTC Calculation: 471 R Axis:   3 Text Interpretation:  Normal sinus rhythm Incomplete left bundle branch block Low voltage, precordial leads Anterior Q waves, possibly due to ILBBB no significant change since Sept 2019 Confirmed by Pricilla LovelessGoldston, Abby Stines [phone]) on 01/13/2019 9:29:05 PM   Radiology Dg Chest Portable 1 View  Result Date: 01/13/2019 CLINICAL DATA:  48 year old female with shortness of breath for 3 days. EXAM:  PORTABLE CHEST 1 VIEW COMPARISON:  05/25/2018 and earlier. FINDINGS: Portable AP upright view at 2002 hours. Stable lung volumes and mediastinal contours. Chronic increased interstitial markings are stable since 2019. No pneumothorax, pleural effusion or acute pulmonary opacity. Visualized tracheal air column is within normal limits. Paucity of bowel gas in the upper abdomen. Stable visualized osseous structures. IMPRESSION: No acute cardiopulmonary abnormality. Electronically Signed   By: Althea GrimmerH  Hall M.D.  On: 01/13/2019 20:32    Procedures Procedures (including critical care time)  Medications Ordered in ED Medications  magnesium sulfate IVPB 2 g 50 mL (0 g Intravenous Stopped 01/13/19 2313)  methylPREDNISolone sodium succinate (SOLU-MEDROL) 125 mg/2 mL injection 125 mg (125 mg Intravenous Given 01/13/19 2040)  albuterol (VENTOLIN HFA) 108 (90 Base) MCG/ACT inhaler 8 puff (8 puffs Inhalation Given 01/13/19 2028)  ipratropium (ATROVENT HFA) inhaler 2 puff (2 puffs Inhalation Given 01/13/19 2115)  aerochamber plus with mask device 1 each (1 each Other Given 01/13/19 2123)  sodium chloride 0.9 % bolus 1,000 mL (0 mLs Intravenous Stopped 01/13/19 2314)     Initial Impression / Assessment and Plan / ED Course  I have reviewed the triage vital signs and the nursing notes.  Pertinent labs & imaging results that were available during my care of the patient were reviewed by me and considered in my medical decision making (see chart for details).        Patient is feeling much better after albuterol treatment, steroids, and magnesium.  She has hyperglycemia but no DKA.  She does not have any significant risk factors for COVID-19 and does not have a fever.  This sounds like multiple prior asthma exacerbations.  I discussed with her again with the interpreter and she feels well enough for discharge.  Her sats are 93% and above.  Her chest x-ray is negative.  She will be given the inhaler and a steroid burst  and cautioned to check her glucose for hyperglycemia.  We discussed return precautions but she appears well enough for discharge at this time.  Melena Hayes was evaluated in Emergency Department on 01/13/2019 for the symptoms described in the history of present illness. She was evaluated in the context of the global COVID-19 pandemic, which necessitated consideration that the patient might be at risk for infection with the SARS-CoV-2 virus that causes COVID-19. Institutional protocols and algorithms that pertain to the evaluation of patients at risk for COVID-19 are in a state of rapid change based on information released by regulatory bodies including the CDC and federal and state organizations. These policies and algorithms were followed during the patient's care in the ED.   Final Clinical Impressions(s) / ED Diagnoses   Final diagnoses:  Moderate persistent asthma with exacerbation    ED Discharge Orders         Ordered    predniSONE (DELTASONE) 20 MG tablet     01/13/19 2241           Pricilla Loveless, MD 01/13/19 2317

## 2019-01-13 NOTE — ED Notes (Signed)
Pt was informed that we need a urine sample. 

## 2019-01-13 NOTE — Discharge Instructions (Addendum)
Use the albuterol inhaler given to you, 2 puffs every 4 hours as needed for cough or shortness of breath.  If you develop high fever, coughing up blood, or worsening shortness of breath you should return to the ER for evaluation.  Start the steroid prescription tomorrow.  Note that this will cause your sugars to increase and you need to carefully monitor this and follow-up closely with your primary care doctor.  Use el inhalador de albuterol que le dieron, 2 inhalaciones cada 4 horas, segn sea necesario para la tos o falta de aire. Si desarrolla fiebre alta, tos con sangre o empeora la falta de [place], debe regresar a la sala de emergencias para su evaluacin. Comience la prescripcin de esteroides maana. Tenga en cuenta que esto har que sus azcares aumenten y debe controlarlo cuidadosamente y hacer un seguimiento cercano con su mdico de atencin primaria.

## 2019-01-13 NOTE — ED Triage Notes (Signed)
Pt presents to ED with complaints of SOB which started 3 days ago. Pt also with productive cough in the morning only. Pt denies fever.

## 2019-01-28 ENCOUNTER — Emergency Department (HOSPITAL_COMMUNITY): Payer: Self-pay

## 2019-01-28 ENCOUNTER — Other Ambulatory Visit: Payer: Self-pay

## 2019-01-28 ENCOUNTER — Encounter (HOSPITAL_COMMUNITY): Payer: Self-pay

## 2019-01-28 ENCOUNTER — Inpatient Hospital Stay (HOSPITAL_COMMUNITY)
Admission: EM | Admit: 2019-01-28 | Discharge: 2019-01-31 | DRG: 202 | Disposition: A | Discharge status: Home / Self Care | Payer: Self-pay | Attending: Internal Medicine | Admitting: Internal Medicine

## 2019-01-28 DIAGNOSIS — IMO0002 Reserved for concepts with insufficient information to code with codable children: Secondary | ICD-10-CM | POA: Diagnosis present

## 2019-01-28 DIAGNOSIS — J45901 Unspecified asthma with (acute) exacerbation: Principal | ICD-10-CM | POA: Diagnosis present

## 2019-01-28 DIAGNOSIS — E1165 Type 2 diabetes mellitus with hyperglycemia: Secondary | ICD-10-CM

## 2019-01-28 DIAGNOSIS — Z7951 Long term (current) use of inhaled steroids: Secondary | ICD-10-CM

## 2019-01-28 DIAGNOSIS — Z6834 Body mass index (BMI) 34.0-34.9, adult: Secondary | ICD-10-CM

## 2019-01-28 DIAGNOSIS — Z20828 Contact with and (suspected) exposure to other viral communicable diseases: Secondary | ICD-10-CM | POA: Diagnosis present

## 2019-01-28 DIAGNOSIS — E669 Obesity, unspecified: Secondary | ICD-10-CM | POA: Diagnosis present

## 2019-01-28 DIAGNOSIS — J4551 Severe persistent asthma with (acute) exacerbation: Secondary | ICD-10-CM

## 2019-01-28 DIAGNOSIS — Z825 Family history of asthma and other chronic lower respiratory diseases: Secondary | ICD-10-CM

## 2019-01-28 DIAGNOSIS — Z794 Long term (current) use of insulin: Secondary | ICD-10-CM

## 2019-01-28 DIAGNOSIS — J9601 Acute respiratory failure with hypoxia: Secondary | ICD-10-CM

## 2019-01-28 DIAGNOSIS — Z79899 Other long term (current) drug therapy: Secondary | ICD-10-CM

## 2019-01-28 DIAGNOSIS — R739 Hyperglycemia, unspecified: Secondary | ICD-10-CM

## 2019-01-28 DIAGNOSIS — J45909 Unspecified asthma, uncomplicated: Secondary | ICD-10-CM | POA: Diagnosis present

## 2019-01-28 LAB — CBC WITH DIFFERENTIAL/PLATELET
Abs Immature Granulocytes: 0.06 10*3/uL (ref 0.00–0.07)
Basophils Absolute: 0.1 10*3/uL (ref 0.0–0.1)
Basophils Relative: 1 %
Eosinophils Absolute: 1.6 10*3/uL — ABNORMAL HIGH (ref 0.0–0.5)
Eosinophils Relative: 13 %
HCT: 49.5 % — ABNORMAL HIGH (ref 36.0–46.0)
Hemoglobin: 17.2 g/dL — ABNORMAL HIGH (ref 12.0–15.0)
Immature Granulocytes: 1 %
Lymphocytes Relative: 22 %
Lymphs Abs: 2.9 10*3/uL (ref 0.7–4.0)
MCH: 31.2 pg (ref 26.0–34.0)
MCHC: 34.7 g/dL (ref 30.0–36.0)
MCV: 89.8 fL (ref 80.0–100.0)
Monocytes Absolute: 0.7 10*3/uL (ref 0.1–1.0)
Monocytes Relative: 5 %
Neutro Abs: 7.5 10*3/uL (ref 1.7–7.7)
Neutrophils Relative %: 58 %
Platelets: 173 10*3/uL (ref 150–400)
RBC: 5.51 MIL/uL — ABNORMAL HIGH (ref 3.87–5.11)
RDW: 12.2 % (ref 11.5–15.5)
WBC: 12.8 10*3/uL — ABNORMAL HIGH (ref 4.0–10.5)
nRBC: 0 % (ref 0.0–0.2)

## 2019-01-28 LAB — BASIC METABOLIC PANEL
Anion gap: 12 (ref 5–15)
BUN: 10 mg/dL (ref 6–20)
CO2: 26 mmol/L (ref 22–32)
Calcium: 9.9 mg/dL (ref 8.9–10.3)
Chloride: 97 mmol/L — ABNORMAL LOW (ref 98–111)
Creatinine, Ser: 0.47 mg/dL (ref 0.44–1.00)
GFR calc Af Amer: >60 mL/min (ref >60)
GFR calc non Af Amer: >60 mL/min (ref >60)
Glucose, Bld: 361 mg/dL — ABNORMAL HIGH (ref 70–99)
Potassium: 4.1 mmol/L (ref 3.5–5.1)
Sodium: 135 mmol/L (ref 135–145)

## 2019-01-28 LAB — GLUCOSE, CAPILLARY: Glucose-Capillary: 340 mg/dL — ABNORMAL HIGH (ref 70–99)

## 2019-01-28 LAB — SARS CORONAVIRUS 2 BY RT PCR (HOSPITAL ORDER, PERFORMED IN ~~LOC~~ HOSPITAL LAB): SARS Coronavirus 2: NEGATIVE

## 2019-01-28 MED ORDER — INSULIN DETEMIR 100 UNIT/ML ~~LOC~~ SOLN
18.0000 [IU] | Freq: Every day | SUBCUTANEOUS | Status: DC
  Administered 2019-01-29: 18 [IU] via SUBCUTANEOUS
  Filled 2019-01-28 (×2): qty 0.18

## 2019-01-28 MED ORDER — INSULIN ASPART 100 UNIT/ML ~~LOC~~ SOLN: 0.0000 [IU] | Freq: Three times a day (TID) | SUBCUTANEOUS | Status: DC

## 2019-01-28 MED ORDER — INSULIN ASPART 100 UNIT/ML ~~LOC~~ SOLN
0.0000 [IU] | Freq: Every day | SUBCUTANEOUS | Status: DC
  Administered 2019-01-29: 4 [IU] via SUBCUTANEOUS

## 2019-01-28 MED ORDER — MOMETASONE FURO-FORMOTEROL FUM 200-5 MCG/ACT IN AERO
2.0000 | INHALATION_SPRAY | Freq: Two times a day (BID) | RESPIRATORY_TRACT | Status: DC
  Administered 2019-01-29 – 2019-01-31 (×5): 2 via RESPIRATORY_TRACT
  Filled 2019-01-28 (×2): qty 8.8

## 2019-01-28 MED ORDER — MAGNESIUM SULFATE 2 GM/50ML IV SOLN
2.0000 g | Freq: Once | INTRAVENOUS | Status: AC
  Administered 2019-01-28: 22:00:00 2 g via INTRAVENOUS
  Filled 2019-01-28: qty 50

## 2019-01-28 MED ORDER — ALBUTEROL SULFATE HFA 108 (90 BASE) MCG/ACT IN AERS
2.0000 | INHALATION_SPRAY | RESPIRATORY_TRACT | Status: DC
  Administered 2019-01-28 – 2019-01-29 (×6): 2 via RESPIRATORY_TRACT
  Filled 2019-01-28: qty 6.7

## 2019-01-28 MED ORDER — METHYLPREDNISOLONE SODIUM SUCC 125 MG IJ SOLR
125.0000 mg | Freq: Once | INTRAMUSCULAR | Status: AC
  Administered 2019-01-28: 22:00:00 125 mg via INTRAVENOUS
  Filled 2019-01-28: qty 2

## 2019-01-28 MED ORDER — METHYLPREDNISOLONE SODIUM SUCC 125 MG IJ SOLR
60.0000 mg | Freq: Four times a day (QID) | INTRAMUSCULAR | Status: DC
  Administered 2019-01-29: 03:00:00 60 mg via INTRAVENOUS
  Filled 2019-01-28: qty 2

## 2019-01-28 MED ORDER — ENOXAPARIN SODIUM 40 MG/0.4ML ~~LOC~~ SOLN
40.0000 mg | SUBCUTANEOUS | Status: DC
  Administered 2019-01-29 – 2019-01-31 (×3): 40 mg via SUBCUTANEOUS
  Filled 2019-01-28 (×3): qty 0.4

## 2019-01-28 MED ORDER — SODIUM CHLORIDE 0.45 % IV SOLN
INTRAVENOUS | Status: AC
  Administered 2019-01-29: via INTRAVENOUS

## 2019-01-28 MED ORDER — ACETAMINOPHEN 650 MG RE SUPP: 650.0000 mg | Freq: Four times a day (QID) | RECTAL | Status: DC | PRN

## 2019-01-28 MED ORDER — ONDANSETRON HCL 4 MG PO TABS: 4.0000 mg | ORAL_TABLET | Freq: Four times a day (QID) | ORAL | Status: DC | PRN

## 2019-01-28 MED ORDER — POLYETHYLENE GLYCOL 3350 17 G PO PACK: 17.0000 g | PACK | Freq: Every day | ORAL | Status: DC | PRN

## 2019-01-28 MED ORDER — ACETAMINOPHEN 325 MG PO TABS: 650.0000 mg | ORAL_TABLET | Freq: Four times a day (QID) | ORAL | Status: DC | PRN

## 2019-01-28 MED ORDER — IPRATROPIUM BROMIDE HFA 17 MCG/ACT IN AERS
2.0000 | INHALATION_SPRAY | Freq: Once | RESPIRATORY_TRACT | Status: AC
  Administered 2019-01-28: 2 via RESPIRATORY_TRACT
  Filled 2019-01-28: qty 12.9

## 2019-01-28 MED ORDER — ONDANSETRON HCL 4 MG/2ML IJ SOLN: 4.0000 mg | Freq: Four times a day (QID) | INTRAMUSCULAR | Status: DC | PRN

## 2019-01-28 MED ORDER — INSULIN DETEMIR 100 UNIT/ML ~~LOC~~ SOLN
18.0000 [IU] | Freq: Two times a day (BID) | SUBCUTANEOUS | Status: DC
  Filled 2019-01-28 (×2): qty 0.18

## 2019-01-28 NOTE — ED Notes (Signed)
Pt had nosebleed following collection of SARS test. Dr Adriana Simas notified. Bleeding stopped with gentle pressure.

## 2019-01-28 NOTE — ED Provider Notes (Addendum)
[facility] EMERGENCY DEPARTMENT Provider Note   CSN: [id number] Arrival date & time: 01/28/19  2047    History   Chief Complaint Chief Complaint  Patient presents with  . Shortness of Breath    HPI Margaret Santana is a 48 y.o. female.     Level 5 caveat for acuity of condition.  Patient presents with cough, dyspnea, wheezing for 3 days.  Past medical history asthma.  She was seen in the ED on 01/13/2019 with similar symptoms.     Past Medical History:  Diagnosis Date  . Asthma   . Diabetes mellitus without complication [facility])     Patient Active Problem List   Diagnosis Date Noted  . Insulin-requiring or dependent type II diabetes mellitus (HCC) 05/25/2018  . Acute hypercapnic respiratory failure (HCC) 05/25/2018  . Acute asthma exacerbation 05/25/2018  . Asthma exacerbation 05/20/2018  . Hyperglycemia 05/20/2018  . Obesity 05/20/2018    Past Surgical History:  Procedure Laterality Date  . CESAREAN SECTION       OB History   No obstetric history on file.      Home Medications    Prior to Admission medications   Medication Sig Start Date End Date Taking? Authorizing Provider  albuterol (PROVENTIL HFA;VENTOLIN HFA) 108 (90 Base) MCG/ACT inhaler Inhale 1-2 puffs into the lungs every 6 (six) hours as needed for wheezing or shortness of breath. 05/22/18   Hoy Register, MD  albuterol (PROVENTIL HFA;VENTOLIN HFA) 108 (90 Base) MCG/ACT inhaler Inhale 2 puffs into the lungs every 4 (four) hours as needed for wheezing or shortness of breath. 05/28/18   Philip Aspen, Limmie Patricia, MD  budesonide-formoterol [facility]) 160-4.5 MCG/ACT inhaler Inhale 2 puffs into the lungs 2 (two) times daily. 05/28/18   Philip Aspen, Limmie Patricia, MD  insulin aspart protamine- aspart (NOVOLOG MIX 70/30) (70-30) 100 UNIT/ML injection Inject 0.36 mLs (36 Units total) into the skin 2 (two) times daily with a meal. 05/28/18   Philip Aspen, Limmie Patricia, MD  Insulin Syringe-Needle U-100  (INSULIN SYRINGE 1CC/28G) 28G X 1/2" 1 ML MISC Use with insulin BID. 05/28/18   Philip Aspen, Limmie Patricia, MD  loratadine (CLARITIN) 10 MG tablet Take 10 mg by mouth daily.    [provider]  metFORMIN (GLUCOPHAGE) 500 MG tablet Take 1 tablet (500 mg total) by mouth 2 (two) times daily with a meal. 05/21/18   Elgergawy, Leana Roe, MD  predniSONE (DELTASONE) 20 MG tablet 2 tabs po daily x 4 days 01/14/19   Pricilla Loveless, MD    Family History Family History  Problem Relation Age of Onset  . Asthma Mother     Social History Social History   Tobacco Use  . Smoking status: Never Smoker  . Smokeless tobacco: Never Used  Substance Use Topics  . Alcohol use: Never    Frequency: Never  . Drug use: Never     Allergies   Patient has no known allergies.   Review of Systems Review of Systems  All other systems reviewed and are negative.    Physical Exam Updated Vital Signs BP (!) 169/107   Pulse (!) 104   Temp 97.6 F (36.4 C) (Oral)   Resp (!) 27   Ht 5' (1.524 m)   Wt 63 kg   LMP 01/10/2019   SpO2 93%   BMI 27.13 kg/m   Physical Exam Vitals signs and nursing note reviewed.  Constitutional:      Appearance: She is well-developed.     Comments:  Initial pulse ox 85%  HENT:     Head: Normocephalic and atraumatic.  Eyes:     Conjunctiva/sclera: Conjunctivae normal.  Neck:     Musculoskeletal: Neck supple.  Cardiovascular:     Rate and Rhythm: Normal rate and regular rhythm.  Pulmonary:     Comments: Tachypneic; bilateral expiratory wheezes Abdominal:     General: Bowel sounds are normal.     Palpations: Abdomen is soft.  Musculoskeletal: Normal range of motion.  Skin:    General: Skin is warm and dry.  Neurological:     Mental Status: She is alert and oriented to person, place, and time.  Psychiatric:        Behavior: Behavior normal.      ED Treatments / Results  Labs (all labs ordered are listed, but only abnormal results are displayed) Labs  Reviewed  CBC WITH DIFFERENTIAL/PLATELET - Abnormal; Notable for the following components:      Result Value   WBC 12.8 (*)    RBC 5.51 (*)    Hemoglobin 17.2 (*)    HCT 49.5 (*)    Eosinophils Absolute 1.6 (*)    All other components within normal limits  BASIC METABOLIC PANEL - Abnormal; Notable for the following components:   Chloride 97 (*)    Glucose, Bld 361 (*)    All other components within normal limits  SARS CORONAVIRUS 2 (HOSPITAL ORDER, PERFORMED IN [facility] LAB)    EKG None  Radiology Dg Chest Port 1 View  Result Date: 01/28/2019 CLINICAL DATA:  Shortness of breath and cough EXAM: PORTABLE CHEST 1 VIEW COMPARISON:  January 13, 2019 FINDINGS: Lungs are clear. Heart size and pulmonary vascularity are normal. No adenopathy. No bone lesions. IMPRESSION: No edema or consolidation. Electronically Signed   By: Bretta Bang III M.D.   On: 01/28/2019 22:00    Procedures Procedures (including critical care time)  Medications Ordered in ED Medications  albuterol (VENTOLIN HFA) 108 (90 Base) MCG/ACT inhaler 2 puff (2 puffs Inhalation Given 01/28/19 2148)  magnesium sulfate IVPB 2 g 50 mL (2 g Intravenous New Bag/Given 01/28/19 2151)  methylPREDNISolone sodium succinate (SOLU-MEDROL) 125 mg/2 mL injection 125 mg (125 mg Intravenous Given 01/28/19 2148)  ipratropium (ATROVENT HFA) inhaler 2 puff (2 puffs Inhalation Given 01/28/19 2213)     Initial Impression / Assessment and Plan / ED Course  I have reviewed the triage vital signs and the nursing notes.  Pertinent labs & imaging results that were available during my care of the patient were reviewed by me and considered in my medical decision making (see chart for details).        Patient presents with asthma attack.  Chest x-ray negative.  IV steroids, IV magnesium, MDI with albuterol and Atrovent.  CRITICAL CARE Performed by: Donnetta Hutching Total critical care time: 30 minutes Critical care time was  exclusive of separately billable procedures and treating other patients. Critical care was necessary to treat or prevent imminent or life-threatening deterioration. Critical care was time spent personally by me on the following activities: development of treatment plan with patient and/or surrogate as well as nursing, discussions with consultants, evaluation of patient's response to treatment, examination of patient, obtaining history from patient or surrogate, ordering and performing treatments and interventions, ordering and review of laboratory studies, ordering and review of radiographic studies, pulse oximetry and re-evaluation of patient's condition.  Final Clinical Impressions(s) / ED Diagnoses   Final diagnoses:  Severe asthma with exacerbation, unspecified whether  persistent  Hyperglycemia    ED Discharge Orders    None       Donnetta Hutchingook, Cono Gebhard, MD 01/28/19 2229    Donnetta Hutchingook, Raygen Dahm, MD 01/28/19 Rolm [id number]    Donnetta Hutchingook, Rosi Secrist, MD 01/28/19 2232

## 2019-01-28 NOTE — ED Triage Notes (Signed)
Pt presents to ED with complaints of shortness of breath x 3 days. Pt with history of asthma. Pt denies fever. Pt with  Non productive cough

## 2019-01-28 NOTE — H&P (Signed)
History and Physical    Margaret Santana ZOX:[id number] DOB: August 04, 1971 DOA: 01/28/2019  PCP: Patient, No Pcp Per   Patient coming from: Home   Chief Complaint: SOB, wheezing   HPI: Margaret Santana is a 48 y.o. female with medical history significant for insulin-dependent diabetes mellitus and asthma never requiring intubation, now presenting to the emergency department for evaluation of shortness of breath, cough, and wheezing.  Patient developed increased dyspnea, nonproductive cough, and wheezing approximately 2 weeks ago, was seen in the emergency department at that time, improved with treatments in the ED, went home, but soon began to worsen again.  She has not had any fever, leg swelling, chest pain, sick contacts, or travel, but has progressively worsened, particularly over the past 3 days, and was having difficulty catching her breath while at rest today.  Her current symptoms are described as similar to her previous experiences with asthma.  She has used her inhalers at home, but with little if any relief.  ED Course: Upon arrival to the ED, patient is found to be afebrile, saturating 80s on room air, tachypneic, slightly tachycardic, and with stable blood pressure.  Chest x-ray is negative for edema or consolidation, chemistry panel notable for glucose of 361, and CBC features a leukocytosis to 12,800 and a chronic mild polycythemia.  Patient was treated with Atrovent, albuterol, 2 g IV magnesium, and 125 mg IV Solu-Medrol in the ED.  She improved significantly with these measures, but continues to be dyspneic at rest and requiring 2 L/min of supplemental oxygen in order to maintain a saturation in the low 90s, and she will be observed for ongoing evaluation and management.  Review of Systems:  All other systems reviewed and apart from HPI, are negative.  Past Medical History:  Diagnosis Date  . Asthma   . Diabetes mellitus without complication [facility])     Past Surgical  History:  Procedure Laterality Date  . CESAREAN SECTION       reports that she has never smoked. She has never used smokeless tobacco. She reports that she does not drink alcohol or use drugs.  No Known Allergies  Family History  Problem Relation Age of Onset  . Asthma Mother      Prior to Admission medications   Medication Sig Start Date End Date Taking? Authorizing Provider  albuterol (PROVENTIL HFA;VENTOLIN HFA) 108 (90 Base) MCG/ACT inhaler Inhale 1-2 puffs into the lungs every 6 (six) hours as needed for wheezing or shortness of breath. 05/22/18   Hoy Register, MD  albuterol (PROVENTIL HFA;VENTOLIN HFA) 108 (90 Base) MCG/ACT inhaler Inhale 2 puffs into the lungs every 4 (four) hours as needed for wheezing or shortness of breath. 05/28/18   Philip Aspen, Limmie Patricia, MD  budesonide-formoterol [facility]) 160-4.5 MCG/ACT inhaler Inhale 2 puffs into the lungs 2 (two) times daily. 05/28/18   Philip Aspen, Limmie Patricia, MD  insulin aspart protamine- aspart (NOVOLOG MIX 70/30) (70-30) 100 UNIT/ML injection Inject 0.36 mLs (36 Units total) into the skin 2 (two) times daily with a meal. 05/28/18   Philip Aspen, Limmie Patricia, MD  Insulin Syringe-Needle U-100 (INSULIN SYRINGE 1CC/28G) 28G X 1/2" 1 ML MISC Use with insulin BID. 05/28/18   Philip Aspen, Limmie Patricia, MD  loratadine (CLARITIN) 10 MG tablet Take 10 mg by mouth daily.    [provider]  metFORMIN (GLUCOPHAGE) 500 MG tablet Take 1 tablet (500 mg total) by mouth 2 (two) times daily with a meal. 05/21/18   Elgergawy, Mliss Fritz  S, MD  predniSONE (DELTASONE) 20 MG tablet 2 tabs po daily x 4 days 01/14/19   Pricilla Loveless, MD    Physical Exam: Vitals:   01/28/19 2121 01/28/19 2130 01/28/19 2200 01/28/19 2230  BP:  (!) 188/98 (!) 169/107 139/90  Pulse:  96 (!) 104 95  Resp:  (!) 28 (!) 27 (!) 9  Temp:      TempSrc:      SpO2: 93%     Weight:      Height:        Constitutional: NAD, calm  Eyes: PERTLA, lids and conjunctivae  normal ENMT: Mucous membranes are moist. Posterior pharynx clear of any exudate or lesions.   Neck: normal, supple, no masses, no thyromegaly Respiratory: Diffuse wheezes. Mild tachypnea. Speaking 3-4 words at a time. No pallor or cyanosis.  Cardiovascular: S1 & S2 heard, regular rate and rhythm. No extremity edema.   Abdomen: No distension, no tenderness, soft. Bowel sounds active.  Musculoskeletal: no clubbing / cyanosis. No joint deformity upper and lower extremities.   Skin: no significant rashes, lesions, ulcers. Warm, dry, well-perfused. Neurologic: CN 2-12 grossly intact. Sensation intact. Strength 5/5 in all 4 limbs.  Psychiatric: Alert and oriented x 3. Pleasant and cooperative.    Labs on Admission: I have personally reviewed following labs and imaging studies  CBC: Recent Labs  Lab 01/28/19 2146  WBC 12.8*  NEUTROABS 7.5  HGB 17.2*  HCT 49.5*  MCV 89.8  PLT 173   Basic Metabolic Panel: Recent Labs  Lab 01/28/19 2146  NA 135  K 4.1  CL 97*  CO2 26  GLUCOSE 361*  BUN 10  CREATININE 0.47  CALCIUM 9.9   GFR: Estimated Creatinine Clearance: 70.5 mL/min (by C-G formula based on SCr of 0.47 mg/dL). Liver Function Tests: No results for input(s): AST, ALT, ALKPHOS, BILITOT, PROT, ALBUMIN in the last 168 hours. No results for input(s): LIPASE, AMYLASE in the last 168 hours. No results for input(s): AMMONIA in the last 168 hours. Coagulation Profile: No results for input(s): INR, PROTIME in the last 168 hours. Cardiac Enzymes: No results for input(s): CKTOTAL, CKMB, CKMBINDEX, TROPONINI in the last 168 hours. BNP (last 3 results) No results for input(s): PROBNP in the last 8760 hours. HbA1C: No results for input(s): HGBA1C in the last 72 hours. CBG: No results for input(s): GLUCAP in the last 168 hours. Lipid Profile: No results for input(s): CHOL, HDL, LDLCALC, TRIG, CHOLHDL, LDLDIRECT in the last 72 hours. Thyroid Function Tests: No results for input(s):  TSH, T4TOTAL, FREET4, T3FREE, THYROIDAB in the last 72 hours. Anemia Panel: No results for input(s): VITAMINB12, FOLATE, FERRITIN, TIBC, IRON, RETICCTPCT in the last 72 hours. Urine analysis: No results found for: COLORURINE, APPEARANCEUR, LABSPEC, PHURINE, GLUCOSEU, HGBUR, BILIRUBINUR, KETONESUR, PROTEINUR, UROBILINOGEN, NITRITE, LEUKOCYTESUR Sepsis Labs: @LABRCNTIP (procalcitonin:4,lacticidven:4) )No results found for this or any previous visit (from the past 240 hour(s)).   Radiological Exams on Admission: Dg Chest Port 1 View  Result Date: 01/28/2019 CLINICAL DATA:  Shortness of breath and cough EXAM: PORTABLE CHEST 1 VIEW COMPARISON:  January 13, 2019 FINDINGS: Lungs are clear. Heart size and pulmonary vascularity are normal. No adenopathy. No bone lesions. IMPRESSION: No edema or consolidation. Electronically Signed   By: Bretta Bang III M.D.   On: 01/28/2019 22:00    EKG: Not performed.   Assessment/Plan   1. Asthma with acute exacerbation; acute hypoxic respiratory failure  - Presents with 3 days of progressive SOB and wheezing, found to be in  distress in ED with sat in 80's on rm air  - CXR is clear, there is no fever, no evidence for PE  - She improved considerably in ED with albuterol, IV mag, and systemic steroids, but continues to be dyspneic at rest and requiring 2 Lpm supplemental O2 in order to maintain saturation in low 90's  - Continue ICS/LABA, continue scheduled albuterol for now, continue systemic steroids, wean supplemental O2 as she improves    2. Insulin-dependent DM  - A1c was 12.4% in September, managed at home with Novolog 70/30 mix and metformin   - Check CBG's and use Levemir with Novolog correctional while in hospital    PPE: Mask, face shield, gloves. Patient wearing mask.  Translator: Video call with Engineer, structuralpanish translator used    DVT prophylaxis: Lovenox  Code Status: Full  Family Communication: Discussed with patient  Consults called: None  Admission status: Observation     Briscoe Deutscherimothy S Airon Sahni, MD [facility] Pager [phone]  If 7PM-7AM, please contact night-coverage [email or web address] Password [email or web address]  01/28/2019, 10:46 PM

## 2019-01-29 DIAGNOSIS — R739 Hyperglycemia, unspecified: Secondary | ICD-10-CM

## 2019-01-29 LAB — BASIC METABOLIC PANEL
Anion gap: 12 (ref 5–15)
BUN: 12 mg/dL (ref 6–20)
CO2: 22 mmol/L (ref 22–32)
Calcium: 9.3 mg/dL (ref 8.9–10.3)
Chloride: 101 mmol/L (ref 98–111)
Creatinine, Ser: 0.48 mg/dL (ref 0.44–1.00)
GFR calc Af Amer: >60 mL/min (ref >60)
GFR calc non Af Amer: >60 mL/min (ref >60)
Glucose, Bld: 377 mg/dL — ABNORMAL HIGH (ref 70–99)
Potassium: 4.5 mmol/L (ref 3.5–5.1)
Sodium: 135 mmol/L (ref 135–145)

## 2019-01-29 LAB — GLUCOSE, CAPILLARY
Glucose-Capillary: 392 mg/dL — ABNORMAL HIGH (ref 70–99)
Glucose-Capillary: 477 mg/dL — ABNORMAL HIGH (ref 70–99)
Glucose-Capillary: 439 mg/dL — ABNORMAL HIGH (ref 70–99)
Glucose-Capillary: 360 mg/dL — ABNORMAL HIGH (ref 70–99)

## 2019-01-29 LAB — CBC WITH DIFFERENTIAL/PLATELET
Abs Immature Granulocytes: 0.07 10*3/uL (ref 0.00–0.07)
Basophils Absolute: 0.1 10*3/uL (ref 0.0–0.1)
Basophils Relative: 1 %
Eosinophils Absolute: 0.1 10*3/uL (ref 0.0–0.5)
Eosinophils Relative: 1 %
HCT: 47.9 % — ABNORMAL HIGH (ref 36.0–46.0)
Hemoglobin: 16.4 g/dL — ABNORMAL HIGH (ref 12.0–15.0)
Immature Granulocytes: 1 %
Lymphocytes Relative: 9 %
Lymphs Abs: 1 10*3/uL (ref 0.7–4.0)
MCH: 31 pg (ref 26.0–34.0)
MCHC: 34.2 g/dL (ref 30.0–36.0)
MCV: 90.5 fL (ref 80.0–100.0)
Monocytes Absolute: 0.1 10*3/uL (ref 0.1–1.0)
Monocytes Relative: 1 %
Neutro Abs: 10.2 10*3/uL — ABNORMAL HIGH (ref 1.7–7.7)
Neutrophils Relative %: 87 %
Platelets: 167 10*3/uL (ref 150–400)
RBC: 5.29 MIL/uL — ABNORMAL HIGH (ref 3.87–5.11)
RDW: 12.6 % (ref 11.5–15.5)
WBC Morphology: INCREASED
WBC: 11.5 10*3/uL — ABNORMAL HIGH (ref 4.0–10.5)
nRBC: 0 % (ref 0.0–0.2)

## 2019-01-29 LAB — HEMOGLOBIN A1C
Hgb A1c MFr Bld: 12.9 % — ABNORMAL HIGH (ref 4.8–5.6)
Mean Plasma Glucose: 323.53 mg/dL

## 2019-01-29 MED ORDER — METHYLPREDNISOLONE SODIUM SUCC 40 MG IJ SOLR
40.0000 mg | Freq: Three times a day (TID) | INTRAMUSCULAR | Status: DC
  Administered 2019-01-29 – 2019-01-31 (×6): 40 mg via INTRAVENOUS
  Filled 2019-01-29 (×6): qty 1

## 2019-01-29 MED ORDER — DM-GUAIFENESIN ER 30-600 MG PO TB12
1.0000 | ORAL_TABLET | Freq: Two times a day (BID) | ORAL | Status: DC
  Administered 2019-01-29 – 2019-01-31 (×5): 1 via ORAL
  Filled 2019-01-29 (×5): qty 1

## 2019-01-29 MED ORDER — HYDROCOD POLST-CPM POLST ER 10-8 MG/5ML PO SUER
5.0000 mL | Freq: Two times a day (BID) | ORAL | Status: DC
  Administered 2019-01-29 – 2019-01-31 (×5): 5 mL via ORAL
  Filled 2019-01-29 (×5): qty 5

## 2019-01-29 MED ORDER — INSULIN ASPART PROT & ASPART (70-30 MIX) 100 UNIT/ML ~~LOC~~ SUSP
45.0000 [IU] | Freq: Two times a day (BID) | SUBCUTANEOUS | Status: DC
  Administered 2019-01-29: 45 [IU] via SUBCUTANEOUS
  Filled 2019-01-29: qty 10

## 2019-01-29 MED ORDER — INSULIN ASPART PROT & ASPART (70-30 MIX) 100 UNIT/ML ~~LOC~~ SUSP
36.0000 [IU] | Freq: Two times a day (BID) | SUBCUTANEOUS | Status: DC
  Administered 2019-01-29: 09:00:00 36 [IU] via SUBCUTANEOUS
  Filled 2019-01-29: qty 10

## 2019-01-29 MED ORDER — INSULIN ASPART 100 UNIT/ML ~~LOC~~ SOLN
0.0000 [IU] | Freq: Every day | SUBCUTANEOUS | Status: DC
  Administered 2019-01-29 – 2019-01-30 (×2): 15 [IU] via SUBCUTANEOUS
  Administered 2019-01-31: 11 [IU] via SUBCUTANEOUS

## 2019-01-29 MED ORDER — IPRATROPIUM-ALBUTEROL 0.5-2.5 (3) MG/3ML IN SOLN
3.0000 mL | Freq: Four times a day (QID) | RESPIRATORY_TRACT | Status: DC
  Administered 2019-01-29 – 2019-01-31 (×8): 3 mL via RESPIRATORY_TRACT
  Filled 2019-01-29 (×8): qty 3

## 2019-01-29 NOTE — Progress Notes (Addendum)
Inpatient Diabetes Program Recommendations  AACE/ADA: New Consensus Statement on Inpatient Glycemic Control (2015)  Target Ranges:  Prepandial:   less than 140 mg/dL      Peak postprandial:   less than 180 mg/dL (1-2 hours)      Critically ill patients:  140 - 180 mg/dL   Review of Glycemic Control  Diabetes history: DM 2  Noted 70/30 dose increased due to hyperglycemia dn steroid dose. Will follow trends   To speak with patient I called RN over the phone who got stratus and helped me speak through interpreter Donzetta Kohut [phone]) with patient. Patient reports newly being diagnosed in November (Notes in Epic looks like new diagnosis in September) Patient was started on 70/30 at that time. Patient reports taking 30 units. Patient was given insulin from hospital and still has a vial left. Patient was only taking 70/30 once a day and sometimes misses doses on Saturday/Sundays. Patient was only checking glucose first thing in the am.. Patient reports not having PCP.  Education with patient involved the type of insulin she was on was made to be given bid instead of daily also discussed checking glucose twice a day (fasting and alternating second check). Encouraged patient to have PCP follow up with clinic or Public Health Department. Discussed what an A1c is and the level of her A1c at time of diagnosis versus now.  Discussed to get her glucose levels down to goal. Patient has all DM supplies.  Patient will need CM for PCP follow up.  Thanks,  Christena Deem RN, MSN, BC-ADM Inpatient Diabetes Coordinator Team Pager [phone] (8a-5p)

## 2019-01-29 NOTE — Progress Notes (Signed)
Patient admitted to unit 300. Discussed admission questions and medications with patient using video Engineer, structural. Patient is resting in bed at this time with call bell within reach. Denies feeling short of breath at this time. Will continue to monitor.

## 2019-01-29 NOTE — Progress Notes (Signed)
PROGRESS NOTE    Margaret Santana   ZOX:[id number]  DOB: September 25, 1970  DOA: 01/28/2019 PCP: Patient, No Pcp Per   Brief Narrative:  Margaret Santana is a 48 y.o. female with medical history significant for insulin-dependent diabetes mellitus and asthma never requiring intubation, now presenting to the emergency department for evaluation of shortness of breath, cough, and wheezing.   Admitted for asthma exacerbation.    Subjective: Still has cough and shortness of breath. Has chest pain due to cough.     Assessment & Plan:   Principal Problem:   Acute asthma exacerbation-   Acute respiratory failure with hypoxia  - add Tussionex, Mucinex DM and Duonebs -  cont IV steroids - still requiring 2 L O2 - cont Dulera  Active Problems:     Type II diabetes mellitus, uncontrolled  - A1c was 12.4% in September and is now 12.9 signifying very poor control -cont 70/30 insulin- sugars remain high- will increase the dose of 70/30     Time spent in minutes: 35 DVT prophylaxis: Lovenox Code Status: Full code Family Communication:  Disposition Plan: home in 1-2 days Consultants:   none Procedures:   none Antimicrobials:  Anti-infectives (From admission, onward)   None       Objective: Vitals:   01/29/19 0504 01/29/19 0727 01/29/19 0729 01/29/19 1139  BP: 103/66     Pulse: 88     Resp: (!) 22     Temp: 98.1 F (36.7 C)     TempSrc: Oral     SpO2: 95% 95% 95% 97%  Weight:      Height:        Intake/Output Summary (Last 24 hours) at 01/29/2019 1451 Last data filed at 01/29/2019 0300 Gross per 24 hour  Intake 365.83 ml  Output -  Net 365.83 ml   Filed Weights   01/28/19 2115 01/28/19 2350  Weight: 63 kg 79.8 kg    Examination: General exam: Appears comfortable  HEENT: PERRLA, oral mucosa moist, no sclera icterus or thrush Respiratory system: + rhonchi, wheezing and coughing. Cardiovascular system: S1 & S2 heard, RRR.   Gastrointestinal system:  Abdomen soft, non-tender, nondistended. Normal bowel sounds. Central nervous system: Alert and oriented. No focal neurological deficits. Extremities: No cyanosis, clubbing or edema Skin: No rashes or ulcers Psychiatry:  Mood & affect appropriate.     Data Reviewed: I have personally reviewed following labs and imaging studies  CBC: Recent Labs  Lab 01/28/19 2146 01/29/19 0434  WBC 12.8* 11.5*  NEUTROABS 7.5 10.2*  HGB 17.2* 16.4*  HCT 49.5* 47.9*  MCV 89.8 90.5  PLT 173 167   Basic Metabolic Panel: Recent Labs  Lab 01/28/19 2146 01/29/19 0434  NA 135 135  K 4.1 4.5  CL 97* 101  CO2 26 22  GLUCOSE 361* 377*  BUN 10 12  CREATININE 0.47 0.48  CALCIUM 9.9 9.3   GFR: Estimated Creatinine Clearance: 79.5 mL/min (by C-G formula based on SCr of 0.48 mg/dL). Liver Function Tests: No results for input(s): AST, ALT, ALKPHOS, BILITOT, PROT, ALBUMIN in the last 168 hours. No results for input(s): LIPASE, AMYLASE in the last 168 hours. No results for input(s): AMMONIA in the last 168 hours. Coagulation Profile: No results for input(s): INR, PROTIME in the last 168 hours. Cardiac Enzymes: No results for input(s): CKTOTAL, CKMB, CKMBINDEX, TROPONINI in the last 168 hours. BNP (last 3 results) No results for input(s): PROBNP in the last 8760 hours. HbA1C: Recent Labs  01/28/19 2146  HGBA1C 12.9*   CBG: Recent Labs  Lab 01/28/19 2345 01/29/19 0734 01/29/19 1111  GLUCAP 340* 392* 477*   Lipid Profile: No results for input(s): CHOL, HDL, LDLCALC, TRIG, CHOLHDL, LDLDIRECT in the last 72 hours. Thyroid Function Tests: No results for input(s): TSH, T4TOTAL, FREET4, T3FREE, THYROIDAB in the last 72 hours. Anemia Panel: No results for input(s): VITAMINB12, FOLATE, FERRITIN, TIBC, IRON, RETICCTPCT in the last 72 hours. Urine analysis: No results found for: COLORURINE, APPEARANCEUR, LABSPEC, PHURINE, GLUCOSEU, HGBUR, BILIRUBINUR, KETONESUR, PROTEINUR, UROBILINOGEN,  NITRITE, LEUKOCYTESUR Sepsis Labs: @LABRCNTIP (procalcitonin:4,lacticidven:4) ) Recent Results (from the past 240 hour(s))  SARS Coronavirus 2 (CEPHEID- Performed in [facility] hospital lab), Hosp Order     Status: None   Collection Time: 01/28/19  9:28 PM  Result Value Ref Range Status   SARS Coronavirus 2 NEGATIVE NEGATIVE Final    Comment: (NOTE) If result is NEGATIVE SARS-CoV-2 target nucleic acids are NOT DETECTED. The SARS-CoV-2 RNA is generally detectable in upper and lower  respiratory specimens during the acute phase of infection. The lowest  concentration of SARS-CoV-2 viral copies this assay can detect is 250  copies / mL. A negative result does not preclude SARS-CoV-2 infection  and should not be used as the sole basis for treatment or other  patient management decisions.  A negative result may occur with  improper specimen collection / handling, submission of specimen other  than nasopharyngeal swab, presence of viral mutation(s) within the  areas targeted by this assay, and inadequate number of viral copies  (<250 copies / mL). A negative result must be combined with clinical  observations, patient history, and epidemiological information. If result is POSITIVE SARS-CoV-2 target nucleic acids are DETECTED. The SARS-CoV-2 RNA is generally detectable in upper and lower  respiratory specimens dur ing the acute phase of infection.  Positive  results are indicative of active infection with SARS-CoV-2.  Clinical  correlation with patient history and other diagnostic information is  necessary to determine patient infection status.  Positive results do  not rule out bacterial infection or co-infection with other viruses. If result is PRESUMPTIVE POSTIVE SARS-CoV-2 nucleic acids MAY BE PRESENT.   A presumptive positive result was obtained on the submitted specimen  and confirmed on repeat testing.  While 2019 novel coronavirus  (SARS-CoV-2) nucleic acids may be present in the  submitted sample  additional confirmatory testing may be necessary for epidemiological  and / or clinical management purposes  to differentiate between  SARS-CoV-2 and other Sarbecovirus currently known to infect humans.  If clinically indicated additional testing with an alternate test  methodology [phone]) is advised. The SARS-CoV-2 RNA is generally  detectable in upper and lower respiratory sp ecimens during the acute  phase of infection. The expected result is Negative. Fact Sheet for Patients:  BoilerBrush.com.cy Fact Sheet for Healthcare Providers: https://pope.com/ This test is not yet approved or cleared by the [place] FDA and has been authorized for detection and/or diagnosis of SARS-CoV-2 by FDA under an Emergency Use Authorization (EUA).  This EUA will remain in effect (meaning this test can be used) for the duration of the COVID-19 declaration under Section 564(b)(1) of the Act, 21 U.S.C. section 360bbb-3(b)(1), unless the authorization is terminated or revoked sooner. Performed at [facility], [address] [place], [place] [address]          Radiology Studies: Dg Chest [facility] 1 View  Result Date: 01/28/2019 CLINICAL DATA:  Shortness of breath and cough EXAM: PORTABLE CHEST  1 VIEW COMPARISON:  January 13, 2019 FINDINGS: Lungs are clear. Heart size and pulmonary vascularity are normal. No adenopathy. No bone lesions. IMPRESSION: No edema or consolidation. Electronically Signed   By: Bretta Bang III M.D.   On: 01/28/2019 22:00      Scheduled Meds: . albuterol  2 puff Inhalation Q4H  . chlorpheniramine-HYDROcodone  5 mL Oral Q12H  . dextromethorphan-guaiFENesin  1 tablet Oral BID  . enoxaparin (LOVENOX) injection  40 mg Subcutaneous Q24H  . insulin aspart  0-15 Units Subcutaneous QAC lunch  . insulin aspart protamine- aspart  36 Units Subcutaneous BID WC  . methylPREDNISolone (SOLU-MEDROL) injection  40 mg  Intravenous Q8H  . mometasone-formoterol  2 puff Inhalation BID   Continuous Infusions:   LOS: 0 days      Calvert Cantor, MD [facility] Pager: [email or web address] Password [facility] 01/29/2019, 2:51 PM

## 2019-01-30 DIAGNOSIS — J45901 Unspecified asthma with (acute) exacerbation: Principal | ICD-10-CM

## 2019-01-30 LAB — GLUCOSE, CAPILLARY
Glucose-Capillary: 314 mg/dL — ABNORMAL HIGH (ref 70–99)
Glucose-Capillary: 357 mg/dL — ABNORMAL HIGH (ref 70–99)
Glucose-Capillary: 272 mg/dL — ABNORMAL HIGH (ref 70–99)
Glucose-Capillary: 260 mg/dL — ABNORMAL HIGH (ref 70–99)

## 2019-01-30 MED ORDER — INSULIN ASPART PROT & ASPART (70-30 MIX) 100 UNIT/ML ~~LOC~~ SUSP
55.0000 [IU] | Freq: Two times a day (BID) | SUBCUTANEOUS | Status: DC
  Administered 2019-01-30 (×2): 55 [IU] via SUBCUTANEOUS
  Filled 2019-01-30: qty 10

## 2019-01-30 NOTE — Plan of Care (Signed)

## 2019-01-30 NOTE — Progress Notes (Signed)
PROGRESS NOTE    Margaret Santana   WUJ:[id number]  DOB: [birth year]  DOA: 01/28/2019 PCP: Patient, No Pcp Per   Brief Narrative:  Margaret Santana is a 48 y.o. female with medical history significant for insulin-dependent diabetes mellitus and asthma never requiring intubation, now presenting to the emergency department for evaluation of shortness of breath, cough, and wheezing.   Admitted for asthma exacerbation.    Subjective: She continues to have wheezing. She is short of breath and is coughing but not as severe.     Assessment & Plan:   Principal Problem:   Acute asthma exacerbation-   Acute respiratory failure with hypoxia  - add Tussionex, Mucinex DM and Duonebs -  cont IV steroids with out change today- she is improving very slowly - still requiring 2 L O2 - cont Dulera  Active Problems:     Type II diabetes mellitus, uncontrolled  - A1c was 12.4% in September and is now 12.9 signifying very poor control -cont 70/30 insulin - sugars still remain high- will increase the dose of 70/30 again today    Time spent in minutes: 35 DVT prophylaxis: Lovenox Code Status: Full code Family Communication:  Disposition Plan: home in 1-2 days when improved Consultants:   none Procedures:   none Antimicrobials:  Anti-infectives (From admission, onward)   None       Objective: Vitals:   01/29/19 2105 01/30/19 0217 01/30/19 0515 01/30/19 0851  BP: 127/65  117/75   Pulse: 93  70   Resp: 16  16   Temp: 97.9 F (36.6 C)  97.8 F (36.6 C)   TempSrc: Oral  Oral   SpO2: 96% 96% 96% 95%  Weight:      Height:        Intake/Output Summary (Last 24 hours) at 01/30/2019 1136 Last data filed at 01/30/2019 0900 Gross per 24 hour  Intake 240 ml  Output -  Net 240 ml   Filed Weights   01/28/19 2115 01/28/19 2350  Weight: 63 kg 79.8 kg    Examination: General exam: Appears comfortable  HEENT: PERRLA, oral mucosa moist, no sclera icterus or thrush  Respiratory system: Still mild wheezing today with cough- Respiratory effort normal. Cardiovascular system: S1 & S2 heard,  No murmurs  Gastrointestinal system: Abdomen soft, non-tender, nondistended. Normal bowel sounds   Central nervous system: Alert and oriented. No focal neurological deficits. Extremities: No cyanosis, clubbing or edema Skin: No rashes or ulcers Psychiatry:  Mood & affect appropriate.    Data Reviewed: I have personally reviewed following labs and imaging studies  CBC: Recent Labs  Lab 01/28/19 2146 01/29/19 0434  WBC 12.8* 11.5*  NEUTROABS 7.5 10.2*  HGB 17.2* 16.4*  HCT 49.5* 47.9*  MCV 89.8 90.5  PLT 173 167   Basic Metabolic Panel: Recent Labs  Lab 01/28/19 2146 01/29/19 0434  NA 135 135  K 4.1 4.5  CL 97* 101  CO2 26 22  GLUCOSE 361* 377*  BUN 10 12  CREATININE 0.47 0.48  CALCIUM 9.9 9.3   GFR: Estimated Creatinine Clearance: 79.5 mL/min (by C-G formula based on SCr of 0.48 mg/dL). Liver Function Tests: No results for input(s): AST, ALT, ALKPHOS, BILITOT, PROT, ALBUMIN in the last 168 hours. No results for input(s): LIPASE, AMYLASE in the last 168 hours. No results for input(s): AMMONIA in the last 168 hours. Coagulation Profile: No results for input(s): INR, PROTIME in the last 168 hours. Cardiac Enzymes: No results for input(s): CKTOTAL, CKMB, CKMBINDEX,  TROPONINI in the last 168 hours. BNP (last 3 results) No results for input(s): PROBNP in the last 8760 hours. HbA1C: Recent Labs    01/28/19 2146  HGBA1C 12.9*   CBG: Recent Labs  Lab 01/29/19 1111 01/29/19 1640 01/29/19 2102 01/30/19 0734 01/30/19 1117  GLUCAP 477* 439* 360* 314* 357*   Lipid Profile: No results for input(s): CHOL, HDL, LDLCALC, TRIG, CHOLHDL, LDLDIRECT in the last 72 hours. Thyroid Function Tests: No results for input(s): TSH, T4TOTAL, FREET4, T3FREE, THYROIDAB in the last 72 hours. Anemia Panel: No results for input(s): VITAMINB12, FOLATE, FERRITIN,  TIBC, IRON, RETICCTPCT in the last 72 hours. Urine analysis: No results found for: COLORURINE, APPEARANCEUR, LABSPEC, PHURINE, GLUCOSEU, HGBUR, BILIRUBINUR, KETONESUR, PROTEINUR, UROBILINOGEN, NITRITE, LEUKOCYTESUR Sepsis Labs: @LABRCNTIP (procalcitonin:4,lacticidven:4) ) Recent Results (from the past 240 hour(s))  SARS Coronavirus 2 (CEPHEID- Performed in [facility] lab), Hosp Order     Status: None   Collection Time: 01/28/19  9:28 PM  Result Value Ref Range Status   SARS Coronavirus 2 NEGATIVE NEGATIVE Final    Comment: (NOTE) If result is NEGATIVE SARS-CoV-2 target nucleic acids are NOT DETECTED. The SARS-CoV-2 RNA is generally detectable in upper and lower  respiratory specimens during the acute phase of infection. The lowest  concentration of SARS-CoV-2 viral copies this assay can detect is 250  copies / mL. A negative result does not preclude SARS-CoV-2 infection  and should not be used as the sole basis for treatment or other  patient management decisions.  A negative result may occur with  improper specimen collection / handling, submission of specimen other  than nasopharyngeal swab, presence of viral mutation(s) within the  areas targeted by this assay, and inadequate number of viral copies  (<250 copies / mL). A negative result must be combined with clinical  observations, patient history, and epidemiological information. If result is POSITIVE SARS-CoV-2 target nucleic acids are DETECTED. The SARS-CoV-2 RNA is generally detectable in upper and lower  respiratory specimens dur ing the acute phase of infection.  Positive  results are indicative of active infection with SARS-CoV-2.  Clinical  correlation with patient history and other diagnostic information is  necessary to determine patient infection status.  Positive results do  not rule out bacterial infection or co-infection with other viruses. If result is PRESUMPTIVE POSTIVE SARS-CoV-2 nucleic acids MAY BE  PRESENT.   A presumptive positive result was obtained on the submitted specimen  and confirmed on repeat testing.  While 2019 novel coronavirus  (SARS-CoV-2) nucleic acids may be present in the submitted sample  additional confirmatory testing may be necessary for epidemiological  and / or clinical management purposes  to differentiate between  SARS-CoV-2 and other Sarbecovirus currently known to infect humans.  If clinically indicated additional testing with an alternate test  methodology [phone]([id number]) is advised. The SARS-CoV-2 RNA is generally  detectable in upper and lower respiratory sp ecimens during the acute  phase of infection. The expected result is Negative. Fact Sheet for Patients:  BoilerBrush.com.cyhttps://www.fda.gov/media/136312/download Fact Sheet for Healthcare Providers: https://pope.com/https://www.fda.gov/media/136313/download This test is not yet approved or cleared by the [place] FDA and has been authorized for detection and/or diagnosis of SARS-CoV-2 by FDA under an Emergency Use Authorization (EUA).  This EUA will remain in effect (meaning this test can be used) for the duration of the COVID-19 declaration under Section 564(b)(1) of the Act, 21 U.S.C. section 360bbb-3(b)(1), unless the authorization is terminated or revoked sooner. Performed at [facility], [address] [place], [place]  [address]          Radiology Studies: Dg Chest Port 1 View  Result Date: 01/28/2019 CLINICAL DATA:  Shortness of breath and cough EXAM: PORTABLE CHEST 1 VIEW COMPARISON:  January 13, 2019 FINDINGS: Lungs are clear. Heart size and pulmonary vascularity are normal. No adenopathy. No bone lesions. IMPRESSION: No edema or consolidation. Electronically Signed   By: Bretta Bang III M.D.   On: 01/28/2019 22:00      Scheduled Meds: . chlorpheniramine-HYDROcodone  5 mL Oral Q12H  . dextromethorphan-guaiFENesin  1 tablet Oral BID  . enoxaparin (LOVENOX) injection  40 mg Subcutaneous Q24H  . insulin  aspart  0-15 Units Subcutaneous QAC lunch  . insulin aspart protamine- aspart  55 Units Subcutaneous BID WC  . ipratropium-albuterol  3 mL Nebulization Q6H  . methylPREDNISolone (SOLU-MEDROL) injection  40 mg Intravenous Q8H  . mometasone-formoterol  2 puff Inhalation BID   Continuous Infusions:   LOS: 1 day      Calvert Cantor, MD [facility] Pager: [email or web address] Password [facility] 01/30/2019, 11:36 AM

## 2019-01-30 NOTE — TOC Transition Note (Signed)
Transition of Care [facility]) - CM/SW Discharge Note   Patient Details  Name: Margaret Santana MRN: [id number] Date of Birth: 1971-02-01  Transition of [facility]) CM/SW Contact:  Malcolm Metro, RN Phone Number: 01/30/2019, 12:35 PM   Clinical Narrative:   CM consult for PCP needs. Pt reports she has been coming to the ED to have refills given for her meds. She pay OOP. She was referred to the [facility] and [facility] last fall but never went.  TOC team has made appointment for pt at the [facility] and [facility]. Rx should be sent there and she may get Rx filled at that pharmacy for a discounted price. Pt says she will have transportation to that pharmacy and for appointment. No other CM needs noted. Will sign off, may be re-consulted should needs arise.      Readmission Risk Interventions Readmission Risk Prevention Plan 01/30/2019  Post Dischage Appt Complete  Medication Screening Complete  Transportation Screening Complete

## 2019-01-31 LAB — BASIC METABOLIC PANEL
Anion gap: 8 (ref 5–15)
BUN: 18 mg/dL (ref 6–20)
CO2: 25 mmol/L (ref 22–32)
Calcium: 8.8 mg/dL — ABNORMAL LOW (ref 8.9–10.3)
Chloride: 104 mmol/L (ref 98–111)
Creatinine, Ser: 0.47 mg/dL (ref 0.44–1.00)
GFR calc Af Amer: >60 mL/min (ref >60)
GFR calc non Af Amer: >60 mL/min (ref >60)
Glucose, Bld: 206 mg/dL — ABNORMAL HIGH (ref 70–99)
Potassium: 4.8 mmol/L (ref 3.5–5.1)
Sodium: 137 mmol/L (ref 135–145)

## 2019-01-31 LAB — CBC
HCT: 46.3 % — ABNORMAL HIGH (ref 36.0–46.0)
Hemoglobin: 15.2 g/dL — ABNORMAL HIGH (ref 12.0–15.0)
MCH: 31 pg (ref 26.0–34.0)
MCHC: 32.8 g/dL (ref 30.0–36.0)
MCV: 94.3 fL (ref 80.0–100.0)
Platelets: 175 10*3/uL (ref 150–400)
RBC: 4.91 MIL/uL (ref 3.87–5.11)
RDW: 12.8 % (ref 11.5–15.5)
WBC: 15.9 10*3/uL — ABNORMAL HIGH (ref 4.0–10.5)
nRBC: 0 % (ref 0.0–0.2)

## 2019-01-31 LAB — GLUCOSE, CAPILLARY
Glucose-Capillary: 208 mg/dL — ABNORMAL HIGH (ref 70–99)
Glucose-Capillary: 305 mg/dL — ABNORMAL HIGH (ref 70–99)

## 2019-01-31 MED ORDER — INSULIN ASPART PROT & ASPART (70-30 MIX) 100 UNIT/ML ~~LOC~~ SUSP
60.0000 [IU] | Freq: Two times a day (BID) | SUBCUTANEOUS | Status: DC
  Administered 2019-01-31: 11:00:00 60 [IU] via SUBCUTANEOUS

## 2019-01-31 MED ORDER — DM-GUAIFENESIN ER 30-600 MG PO TB12: 1.0000 | ORAL_TABLET | Freq: Two times a day (BID) | ORAL | 0 refills | Status: AC

## 2019-01-31 MED ORDER — MONTELUKAST SODIUM 10 MG PO TABS: 10.0000 mg | ORAL_TABLET | Freq: Every day | ORAL | 2 refills | Status: AC

## 2019-01-31 MED ORDER — ACETAMINOPHEN 325 MG PO TABS: 650.0000 mg | ORAL_TABLET | Freq: Four times a day (QID) | ORAL | Status: AC | PRN

## 2019-01-31 MED ORDER — INSULIN ASPART PROT & ASPART (70-30 MIX) 100 UNIT/ML ~~LOC~~ SUSP: 55.0000 [IU] | Freq: Two times a day (BID) | SUBCUTANEOUS | 11 refills | Status: AC

## 2019-01-31 MED ORDER — BUDESONIDE-FORMOTEROL FUMARATE 160-4.5 MCG/ACT IN AERO: 2.0000 | INHALATION_SPRAY | Freq: Two times a day (BID) | RESPIRATORY_TRACT | 5 refills | Status: AC

## 2019-01-31 MED ORDER — HYDROCOD POLST-CPM POLST ER 10-8 MG/5ML PO SUER: 5.0000 mL | Freq: Two times a day (BID) | ORAL | 0 refills | Status: AC

## 2019-01-31 MED ORDER — ALBUTEROL SULFATE HFA 108 (90 BASE) MCG/ACT IN AERS: 1.0000 | INHALATION_SPRAY | Freq: Four times a day (QID) | RESPIRATORY_TRACT | 0 refills | Status: AC | PRN

## 2019-01-31 MED ORDER — PREDNISONE 10 MG PO TABS: 60.0000 mg | ORAL_TABLET | Freq: Every day | ORAL | 0 refills | Status: DC

## 2019-01-31 NOTE — Discharge Summary (Signed)
Physician Discharge Summary  Margaret Santana AOZ:[id number] DOB: 1971/06/25 DOA: 01/28/2019  PCP: Patient, No Pcp Per  Admit date: 01/28/2019 Discharge date: 01/31/2019  Admitted From: home Disposition:  home      Discharge Condition:  stable   CODE STATUS:  Full code   Consultations:  none    Discharge Diagnoses:  Principal Problem:   Acute asthma exacerbation -Acute respiratory failure with hypoxia (HCC) Active Problems:    Type II diabetes mellitus, uncontrolled (HCC)  Obesity      Brief Summary: Margaret Santana is a 48 y.o.femalewith medical history significant forinsulin-dependent diabetes mellitus and asthma never requiring intubation, now presenting to the emergency department for evaluation of shortness of breath, cough, and wheezing.  Admitted for asthma exacerbation.   Hospital Course:  Principal Problem:   Acute asthma exacerbation-   Acute respiratory failure with hypoxia  - added Tussionex, Mucinex DM, Singulair, IV steroids and Duonebs - she has improved slowly- she is stable for d/c to home today  Active Problems:     Type II diabetes mellitus, uncontrolled  - A1c was 12.4% in September and is now 12.9 signifying very poor control -cont 70/30 insulin but dose increased to better control sugars- discussed adhering to a diabetic diet and exercising to lose weight  Obesity- advised to lose weight Body mass index is 34.36 kg/m.    Discharge Exam: Vitals:   01/31/19 0746 01/31/19 0757  BP:    Pulse:    Resp:    Temp:    SpO2: 97% 97%   Vitals:   01/31/19 0120 01/31/19 0607 01/31/19 0746 01/31/19 0757  BP:  126/68    Pulse:  69    Resp:  16    Temp:  98.1 F (36.7 C)    TempSrc:  Oral    SpO2: 96% 95% 97% 97%  Weight:      Height:        General: Pt is alert, awake, not in acute distress Cardiovascular: RRR, S1/S2 +, no rubs, no gallops Respiratory: CTA bilaterally, no wheezing, no rhonchi Abdominal: Soft, NT,  ND, bowel sounds + Extremities: no edema, no cyanosis   Discharge Instructions  Discharge Instructions    Diet - low sodium heart healthy   Complete by:  As directed    Diet Carb Modified   Complete by:  As directed    Increase activity slowly   Complete by:  As directed      Allergies as of 01/31/2019   No Known Allergies     Medication List    TAKE these medications   acetaminophen 325 MG tablet Commonly known as:  TYLENOL Take 2 tablets (650 mg total) by mouth every 6 (six) hours as needed for mild pain (or Fever >/= 101).   albuterol 108 (90 Base) MCG/ACT inhaler Commonly known as:  VENTOLIN HFA Inhale 1-2 puffs into the lungs every 6 (six) hours as needed for wheezing or shortness of breath. What changed:  Another medication with the same name was removed. Continue taking this medication, and follow the directions you see here.   budesonide-formoterol 160-4.5 MCG/ACT inhaler Commonly known as:  Symbicort Inhale 2 puffs into the lungs 2 (two) times daily.   chlorpheniramine-HYDROcodone 10-8 MG/5ML Suer Commonly known as:  TUSSIONEX Take 5 mLs by mouth every 12 (twelve) hours.   dextromethorphan-guaiFENesin 30-600 MG 12hr tablet Commonly known as:  MUCINEX DM Take 1 tablet by mouth 2 (two) times daily.   insulin aspart protamine- aspart (70-30)  100 UNIT/ML injection Commonly known as:  NOVOLOG MIX 70/30 Inject 0.55 mLs (55 Units total) into the skin 2 (two) times daily with a meal. What changed:  how much to take   loratadine 10 MG tablet Commonly known as:  CLARITIN Take 10 mg by mouth daily.   metFORMIN 500 MG tablet Commonly known as:  GLUCOPHAGE Take 1 tablet (500 mg total) by mouth 2 (two) times daily with a meal.   montelukast 10 MG tablet Commonly known as:  SINGULAIR Take 1 tablet (10 mg total) by mouth daily.   predniSONE 10 MG tablet Commonly known as:  DELTASONE Take 6 tablets (60 mg total) by mouth daily with breakfast. Take 6 tabs tomorrow  and then decrease by 1 tab daily until finished      Follow-up Information    [facility]. Call on 02/12/2019.   Why:  Please follow up with [facility] on Wednesday, May 27th at 9:00am. Contact information: [address] [place] [address] [phone]         No Known Allergies   Procedures/Studies:    Dg Chest Port 1 View  Result Date: 01/28/2019 CLINICAL DATA:  Shortness of breath and cough EXAM: PORTABLE CHEST 1 VIEW COMPARISON:  January 13, 2019 FINDINGS: Lungs are clear. Heart size and pulmonary vascularity are normal. No adenopathy. No bone lesions. IMPRESSION: No edema or consolidation. Electronically Signed   By: Bretta Bang III M.D.   On: 01/28/2019 22:00   Dg Chest Portable 1 View  Result Date: 01/13/2019 CLINICAL DATA:  48 year old female with shortness of breath for 3 days. EXAM: PORTABLE CHEST 1 VIEW COMPARISON:  05/25/2018 and earlier. FINDINGS: Portable AP upright view at 2002 hours. Stable lung volumes and mediastinal contours. Chronic increased interstitial markings are stable since 2019. No pneumothorax, pleural effusion or acute pulmonary opacity. Visualized tracheal air column is within normal limits. Paucity of bowel gas in the upper abdomen. Stable visualized osseous structures. IMPRESSION: No acute cardiopulmonary abnormality. Electronically Signed   By: Odessa Fleming M.D.   On: 01/13/2019 20:32     The results of significant diagnostics from this hospitalization (including imaging, microbiology, ancillary and laboratory) are listed below for reference.     Microbiology: Recent Results (from the past 240 hour(s))  SARS Coronavirus 2 (CEPHEID- Performed in [facility] hospital lab), Hosp Order     Status: None   Collection Time: 01/28/19  9:28 PM  Result Value Ref Range Status   SARS Coronavirus 2 NEGATIVE NEGATIVE Final    Comment: (NOTE) If result is NEGATIVE SARS-CoV-2  target nucleic acids are NOT DETECTED. The SARS-CoV-2 RNA is generally detectable in upper and lower  respiratory specimens during the acute phase of infection. The lowest  concentration of SARS-CoV-2 viral copies this assay can detect is 250  copies / mL. A negative result does not preclude SARS-CoV-2 infection  and should not be used as the sole basis for treatment or other  patient management decisions.  A negative result may occur with  improper specimen collection / handling, submission of specimen other  than nasopharyngeal swab, presence of viral mutation(s) within the  areas targeted by this assay, and inadequate number of viral copies  (<250 copies / mL). A negative result must be combined with clinical  observations, patient history, and epidemiological information. If result is POSITIVE SARS-CoV-2 target nucleic acids are DETECTED. The SARS-CoV-2 RNA is generally detectable in upper and lower  respiratory specimens dur ing the acute phase of infection.  Positive  results are indicative of active infection with SARS-CoV-2.  Clinical  correlation with patient history and other diagnostic information is  necessary to determine patient infection status.  Positive results do  not rule out bacterial infection or co-infection with other viruses. If result is PRESUMPTIVE POSTIVE SARS-CoV-2 nucleic acids MAY BE PRESENT.   A presumptive positive result was obtained on the submitted specimen  and confirmed on repeat testing.  While 2019 novel coronavirus  (SARS-CoV-2) nucleic acids may be present in the submitted sample  additional confirmatory testing may be necessary for epidemiological  and / or clinical management purposes  to differentiate between  SARS-CoV-2 and other Sarbecovirus currently known to infect humans.  If clinically indicated additional testing with an alternate test  methodology [phone]) is advised. The SARS-CoV-2 RNA is generally  detectable in upper and lower  respiratory sp ecimens during the acute  phase of infection. The expected result is Negative. Fact Sheet for Patients:  BoilerBrush.com.cyhttps://www.fda.gov/media/136312/download Fact Sheet for Healthcare Providers: https://pope.com/https://www.fda.gov/media/136313/download This test is not yet approved or cleared by the [place] FDA and has been authorized for detection and/or diagnosis of SARS-CoV-2 by FDA under an Emergency Use Authorization (EUA).  This EUA will remain in effect (meaning this test can be used) for the duration of the COVID-19 declaration under Section 564(b)(1) of the Act, 21 U.S.C. section 360bbb-3(b)(1), unless the authorization is terminated or revoked sooner. Performed at [facility], [address] [place], [place] [address]      Labs: BNP (last 3 results) Recent Labs    05/25/18 0055  BNP 33.0   Basic Metabolic Panel: Recent Labs  Lab 01/28/19 2146 01/29/19 0434 01/31/19 0504  NA 135 135 137  K 4.1 4.5 4.8  CL 97* 101 104  CO2 26 22 25   GLUCOSE 361* 377* 206*  BUN 10 12 18   CREATININE 0.47 0.48 0.47  CALCIUM 9.9 9.3 8.8*   Liver Function Tests: No results for input(s): AST, ALT, ALKPHOS, BILITOT, PROT, ALBUMIN in the last 168 hours. No results for input(s): LIPASE, AMYLASE in the last 168 hours. No results for input(s): AMMONIA in the last 168 hours. CBC: Recent Labs  Lab 01/28/19 2146 01/29/19 0434 01/31/19 0504  WBC 12.8* 11.5* 15.9*  NEUTROABS 7.5 10.2*  --   HGB 17.2* 16.4* 15.2*  HCT 49.5* 47.9* 46.3*  MCV 89.8 90.5 94.3  PLT 173 167 175   Cardiac Enzymes: No results for input(s): CKTOTAL, CKMB, CKMBINDEX, TROPONINI in the last 168 hours. BNP: Invalid input(s): POCBNP CBG: Recent Labs  Lab 01/30/19 1117 01/30/19 1622 01/30/19 2129 01/31/19 0817 01/31/19 1134  GLUCAP 357* 272* 260* 208* 305*   D-Dimer No results for input(s): DDIMER in the last 72 hours. Hgb A1c Recent Labs    01/28/19 2146  HGBA1C 12.9*   Lipid Profile No results for  input(s): CHOL, HDL, LDLCALC, TRIG, CHOLHDL, LDLDIRECT in the last 72 hours. Thyroid function studies No results for input(s): TSH, T4TOTAL, T3FREE, THYROIDAB in the last 72 hours.  Invalid input(s): FREET3 Anemia work up No results for input(s): VITAMINB12, FOLATE, FERRITIN, TIBC, IRON, RETICCTPCT in the last 72 hours. Urinalysis No results found for: COLORURINE, APPEARANCEUR, LABSPEC, PHURINE, GLUCOSEU, HGBUR, BILIRUBINUR, KETONESUR, PROTEINUR, UROBILINOGEN, NITRITE, LEUKOCYTESUR Sepsis Labs Invalid input(s): PROCALCITONIN,  WBC,  LACTICIDVEN Microbiology Recent Results (from the past 240 hour(s))  SARS Coronavirus 2 (CEPHEID- Performed in [facility] hospital lab), Hosp Order     Status:  None   Collection Time: 01/28/19  9:28 PM  Result Value Ref Range Status   SARS Coronavirus 2 NEGATIVE NEGATIVE Final    Comment: (NOTE) If result is NEGATIVE SARS-CoV-2 target nucleic acids are NOT DETECTED. The SARS-CoV-2 RNA is generally detectable in upper and lower  respiratory specimens during the acute phase of infection. The lowest  concentration of SARS-CoV-2 viral copies this assay can detect is 250  copies / mL. A negative result does not preclude SARS-CoV-2 infection  and should not be used as the sole basis for treatment or other  patient management decisions.  A negative result may occur with  improper specimen collection / handling, submission of specimen other  than nasopharyngeal swab, presence of viral mutation(s) within the  areas targeted by this assay, and inadequate number of viral copies  (<250 copies / mL). A negative result must be combined with clinical  observations, patient history, and epidemiological information. If result is POSITIVE SARS-CoV-2 target nucleic acids are DETECTED. The SARS-CoV-2 RNA is generally detectable in upper and lower  respiratory specimens dur ing the acute phase of infection.  Positive  results are indicative of active infection with  SARS-CoV-2.  Clinical  correlation with patient history and other diagnostic information is  necessary to determine patient infection status.  Positive results do  not rule out bacterial infection or co-infection with other viruses. If result is PRESUMPTIVE POSTIVE SARS-CoV-2 nucleic acids MAY BE PRESENT.   A presumptive positive result was obtained on the submitted specimen  and confirmed on repeat testing.  While 2019 novel coronavirus  (SARS-CoV-2) nucleic acids may be present in the submitted sample  additional confirmatory testing may be necessary for epidemiological  and / or clinical management purposes  to differentiate between  SARS-CoV-2 and other Sarbecovirus currently known to infect humans.  If clinically indicated additional testing with an alternate test  methodology [phone]) is advised. The SARS-CoV-2 RNA is generally  detectable in upper and lower respiratory sp ecimens during the acute  phase of infection. The expected result is Negative. Fact Sheet for Patients:  BoilerBrush.com.cy Fact Sheet for Healthcare Providers: https://pope.com/ This test is not yet approved or cleared by the [place] FDA and has been authorized for detection and/or diagnosis of SARS-CoV-2 by FDA under an Emergency Use Authorization (EUA).  This EUA will remain in effect (meaning this test can be used) for the duration of the COVID-19 declaration under Section 564(b)(1) of the Act, 21 U.S.C. section 360bbb-3(b)(1), unless the authorization is terminated or revoked sooner. Performed at [facility], [address] [place], [place] [address]      Time coordinating discharge in minutes: 65  SIGNED:   Calvert Cantor, MD  [facility] 01/31/2019, 1:27 PM Pager   If 7PM-7AM, please contact night-coverage [email or web address] Password [email or web address]

## 2019-01-31 NOTE — Progress Notes (Signed)
Inpatient Diabetes Program Recommendations  AACE/ADA: New Consensus Statement on Inpatient Glycemic Control (2015)  Target Ranges:  Prepandial:   less than 140 mg/dL      Peak postprandial:   less than 180 mg/dL (1-2 hours)      Critically ill patients:  140 - 180 mg/dL   Results for RIVAS ESTEPHANIA, FREDRICH (MRN [id number]) as of 01/31/2019 11:38  Ref. Range 01/30/2019 07:34 01/30/2019 11:17 01/30/2019 16:22 01/30/2019 21:29  Glucose-Capillary Latest Ref Range: 70 - 99 mg/dL 283 (H)    55 units 66/29 Insulin 357 (H)  15 units NOVOLOG  272 (H)    55 units 70/30 Insulin 260 (H)   Results for RIVAS LASHEKA, BOGDON (MRN [id number]) as of 01/31/2019 11:38  Ref. Range 01/31/2019 08:17 01/31/2019 11:34  Glucose-Capillary Latest Ref Range: 70 - 99 mg/dL 546 (H)    60 units 56/81 Insulin 305 (H)    Home DM Meds: 70/30 Insulin- 36 units BID       Metformin 500 mg BID   Current Orders: 70/30 Insulin- 60 units BID      Novolog SSi at [facility] only      MD- Note 70/30 Insulin increased to 60 units BID this AM.  Patient remains on Solumedrol 40 mg Q8 hours which is likely contributing to sustained hyperglycemia.  May consider adding Novolog SSi to Breakfast and Dinner time as well to help with elevated CBGs.  Currently only getting Novolog SSI at [facility]     --Will follow patient during hospitalization--  Ambrose Finland RN, MSN, CDE Diabetes Coordinator Inpatient Glycemic Control Team Team Pager: [phone] (8a-5p)

## 2019-01-31 NOTE — Progress Notes (Signed)
Nsg Discharge Note  Admit Date:  01/28/2019 Discharge date: 01/31/2019   Margaret Santana to be D/C'd Home per MD order.  AVS completed.  Copy for chart, and copy for patient signed, and dated. Patient able to verbalize understanding. Translator services were utilized for this discharge. Margaret Santana, Spanish Translator # [phone] was used at time of discharge.   Discharge Medication: Allergies as of 01/31/2019   No Known Allergies     Medication List    TAKE these medications   acetaminophen 325 MG tablet Commonly known as:  TYLENOL Take 2 tablets (650 mg total) by mouth every 6 (six) hours as needed for mild pain (or Fever >/= 101).   albuterol 108 (90 Base) MCG/ACT inhaler Commonly known as:  VENTOLIN HFA Inhale 1-2 puffs into the lungs every 6 (six) hours as needed for wheezing or shortness of breath. What changed:  Another medication with the same name was removed. Continue taking this medication, and follow the directions you see here.   budesonide-formoterol 160-4.5 MCG/ACT inhaler Commonly known as:  Symbicort Inhale 2 puffs into the lungs 2 (two) times daily. What changed:  Another medication with the same name was removed. Continue taking this medication, and follow the directions you see here.   chlorpheniramine-HYDROcodone 10-8 MG/5ML Suer Commonly known as:  TUSSIONEX Take 5 mLs by mouth every 12 (twelve) hours.   dextromethorphan-guaiFENesin 30-600 MG 12hr tablet Commonly known as:  MUCINEX DM Take 1 tablet by mouth 2 (two) times daily.   insulin aspart protamine- aspart (70-30) 100 UNIT/ML injection Commonly known as:  NOVOLOG MIX 70/30 Inject 0.55 mLs (55 Units total) into the skin 2 (two) times daily with a meal. What changed:    how much to take  Another medication with the same name was removed. Continue taking this medication, and follow the directions you see here.   loratadine 10 MG tablet Commonly known as:  CLARITIN Take 10 mg by mouth daily.   metFORMIN 500 MG tablet Commonly known as:  GLUCOPHAGE Take 1 tablet (500 mg total) by mouth 2 (two) times daily with a meal.   montelukast 10 MG tablet Commonly known as:  SINGULAIR Take 1 tablet (10 mg total) by mouth daily.   predniSONE 10 MG tablet Commonly known as:  DELTASONE Take 6 tablets (60 mg total) by mouth daily with breakfast. Take 6 tabs tomorrow and then decrease by 1 tab daily until finished   THEOPHYLLINE CR PO Take 10 mg by mouth daily.       Discharge Assessment: Vitals:   01/31/19 1454 01/31/19 1500  BP: 120/69   Pulse: 86   Resp: 19   Temp: 98.2 F (36.8 C)   SpO2: 95% 92%   Skin clean, dry and intact without evidence of skin break down, no evidence of skin tears noted. IV catheter discontinued intact. Site without signs and symptoms of complications - no redness or edema noted at insertion site, patient denies c/o pain - only slight tenderness at site.  Dressing with slight pressure applied.  D/c Instructions-Education: Discharge instructions given to patient with verbalized understanding with the use of [employer] Translator services [name] # [phone] D/c education completed with patient including follow up instructions, medication list, d/c activities limitations if indicated, with other d/c instructions as indicated by MD - patient able to verbalize understanding, all questions fully answered. Patient instructed to return to ED, call 911, or call MD for any changes in condition.  Patient escorted via WC, and D/C home via  private auto.  Jethro Poling, RN 01/31/2019 3:45 PM

## 2019-02-12 ENCOUNTER — Inpatient Hospital Stay: Payer: Self-pay | Admitting: Critical Care Medicine

## 2019-02-12 NOTE — Progress Notes (Deleted)
   Subjective:    Patient ID: Margaret Santana, female    DOB: [birth year], 48 y.o.   MRN: [id number]  Admit date: 01/28/2019 Discharge date: 01/31/2019  Admitted From: home Disposition:  home      Discharge Diagnoses:  Principal Problem:   Acute asthma exacerbation -Acute respiratory failure with hypoxia (HCC) Active Problems:    Type II diabetes mellitus, uncontrolled (HCC)  Obesity      Brief Summary: Margaret Asay Maldonadois a 48 y.o.femalewith medical history significant forinsulin-dependent diabetes mellitus and asthma never requiring intubation, now presenting to the emergency department for evaluation of shortness of breath, cough, and wheezing.  Admitted for asthma exacerbation.   Hospital Course:  Principal Problem: Acute asthma exacerbation- Acute respiratory failure with hypoxia  - added Tussionex, Mucinex DM, Singulair, IV steroids and Duonebs - she has improved slowly- she is stable for d/c to home today  Active Problems:  Type II diabetes mellitus, uncontrolled  - A1c was 12.4% in September and is now 12.9 signifying very poor control -cont 70/30 insulin but dose increased to better control sugars- discussed adhering to a diabetic diet and exercising to lose weight  Obesity- advised to lose weight Body mass index is 34.36 kg/m.      Review of Systems     Objective:   Physical Exam        Assessment & Plan:

## 2019-10-15 IMAGING — DX DG CHEST 2V
2 series · 2 of 2 positions shown · non-contrast
Comparison: None.

CLINICAL DATA: 46-year-old female with shortness of breath.

EXAM:
CHEST - 2 VIEW

[chest pa]
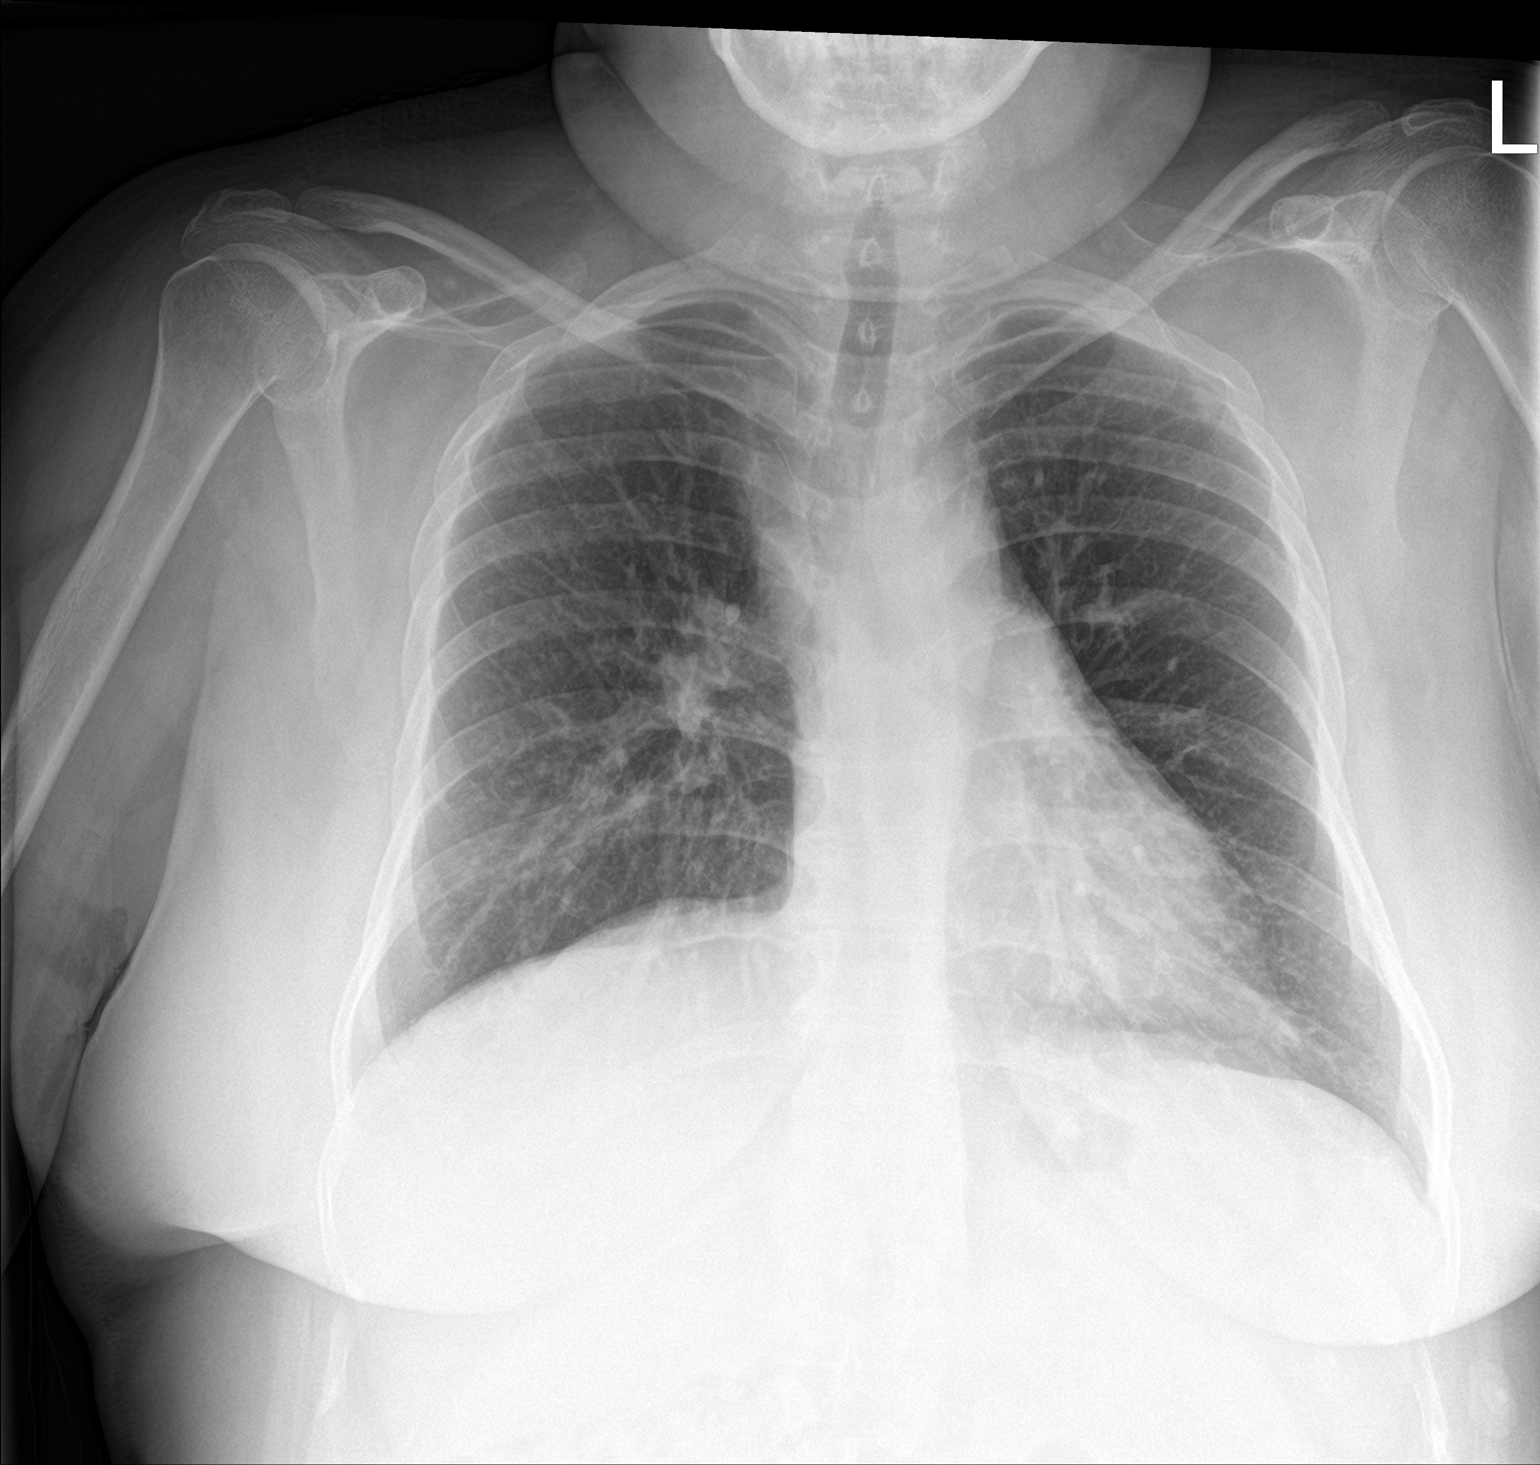

[chest lat]
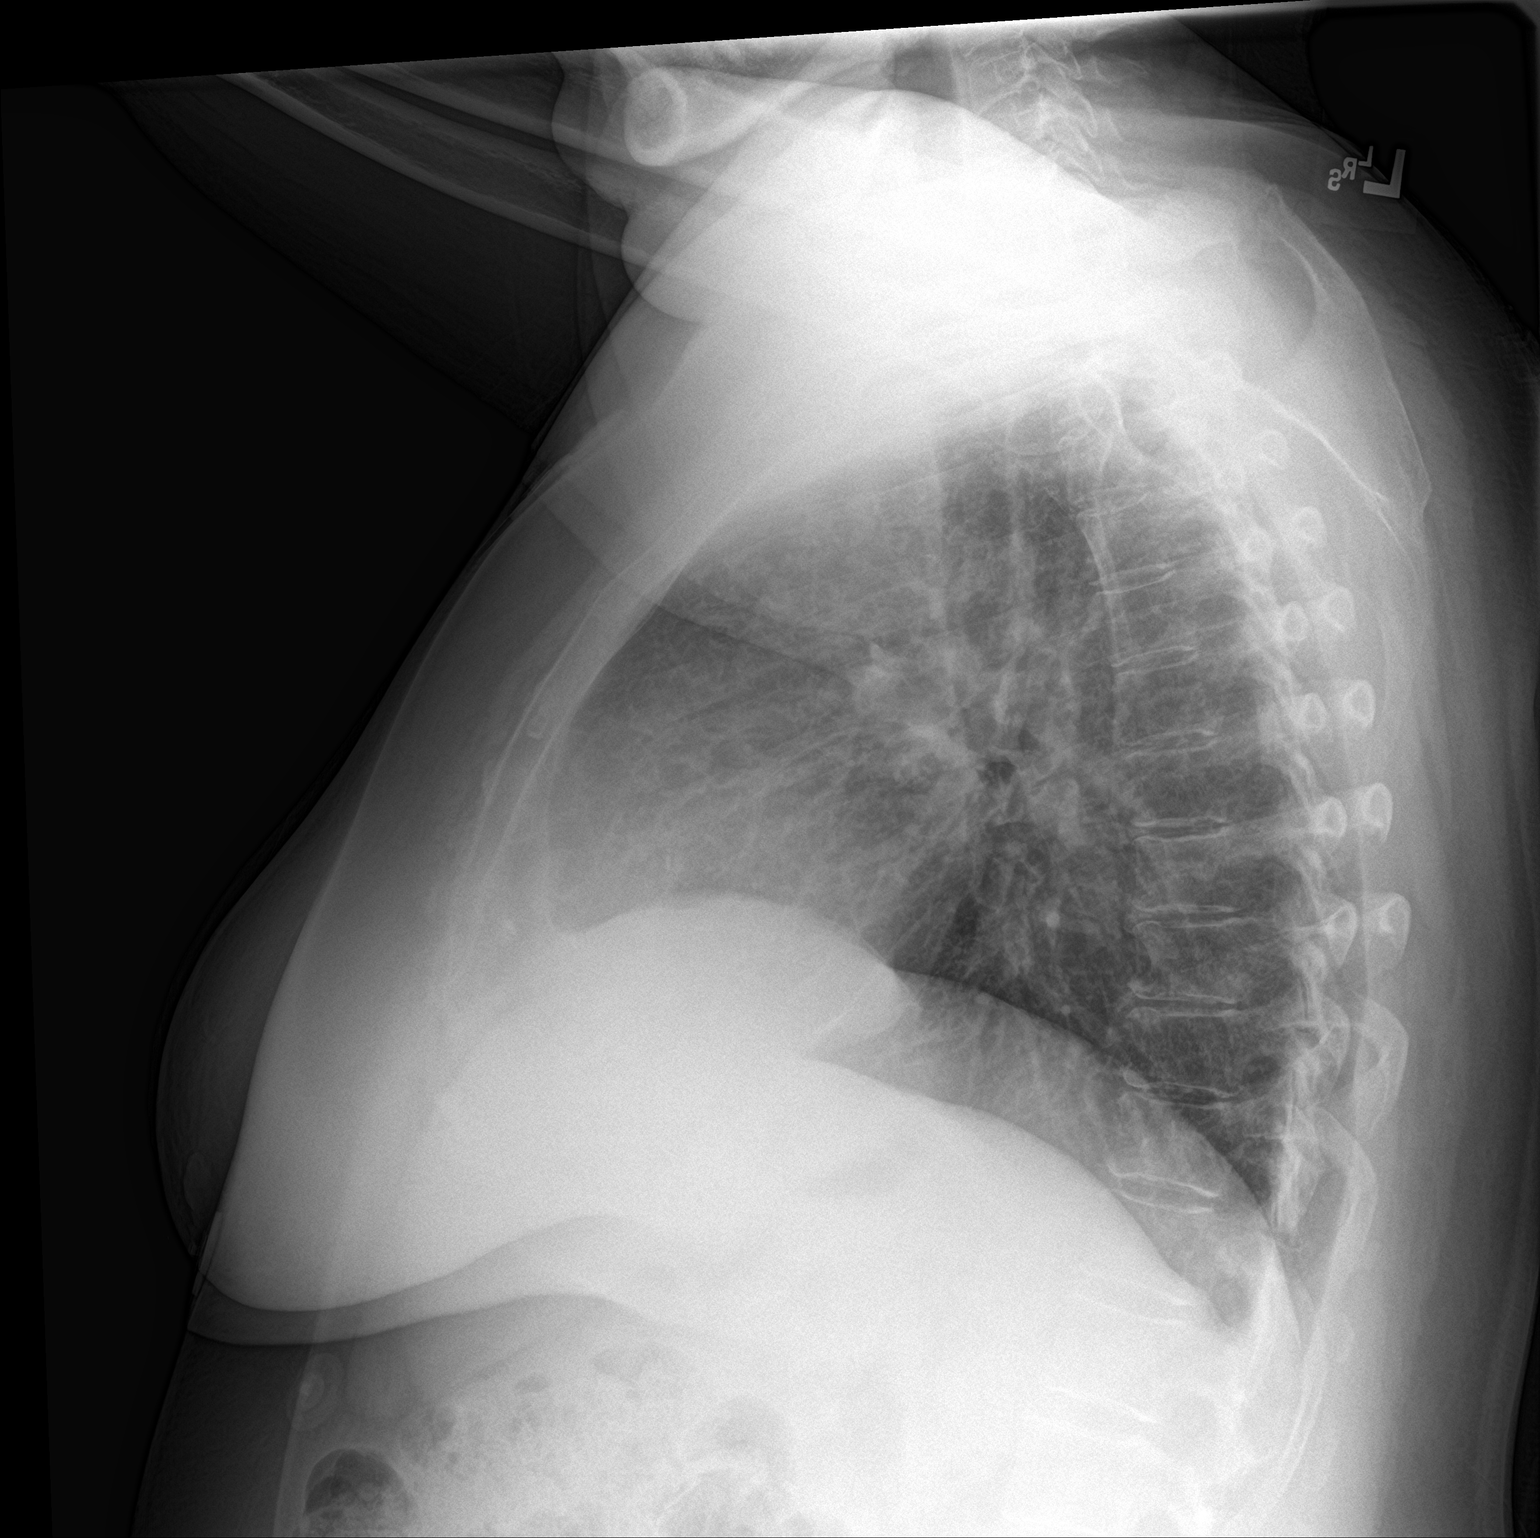

[2 of 2 positions shown; findings below may reference images not displayed]

FINDINGS: The heart size and mediastinal contours are within normal limits.
Both lungs are clear. The visualized skeletal structures are
unremarkable.
IMPRESSION: No active cardiopulmonary disease.

## 2019-10-20 IMAGING — CR DG CHEST 1V PORT
1 series · 1 of 1 positions shown · non-contrast
Comparison: 05/25/2018

CLINICAL DATA: Asthma exacerbation.

EXAM:
PORTABLE CHEST 1 VIEW

[portable]
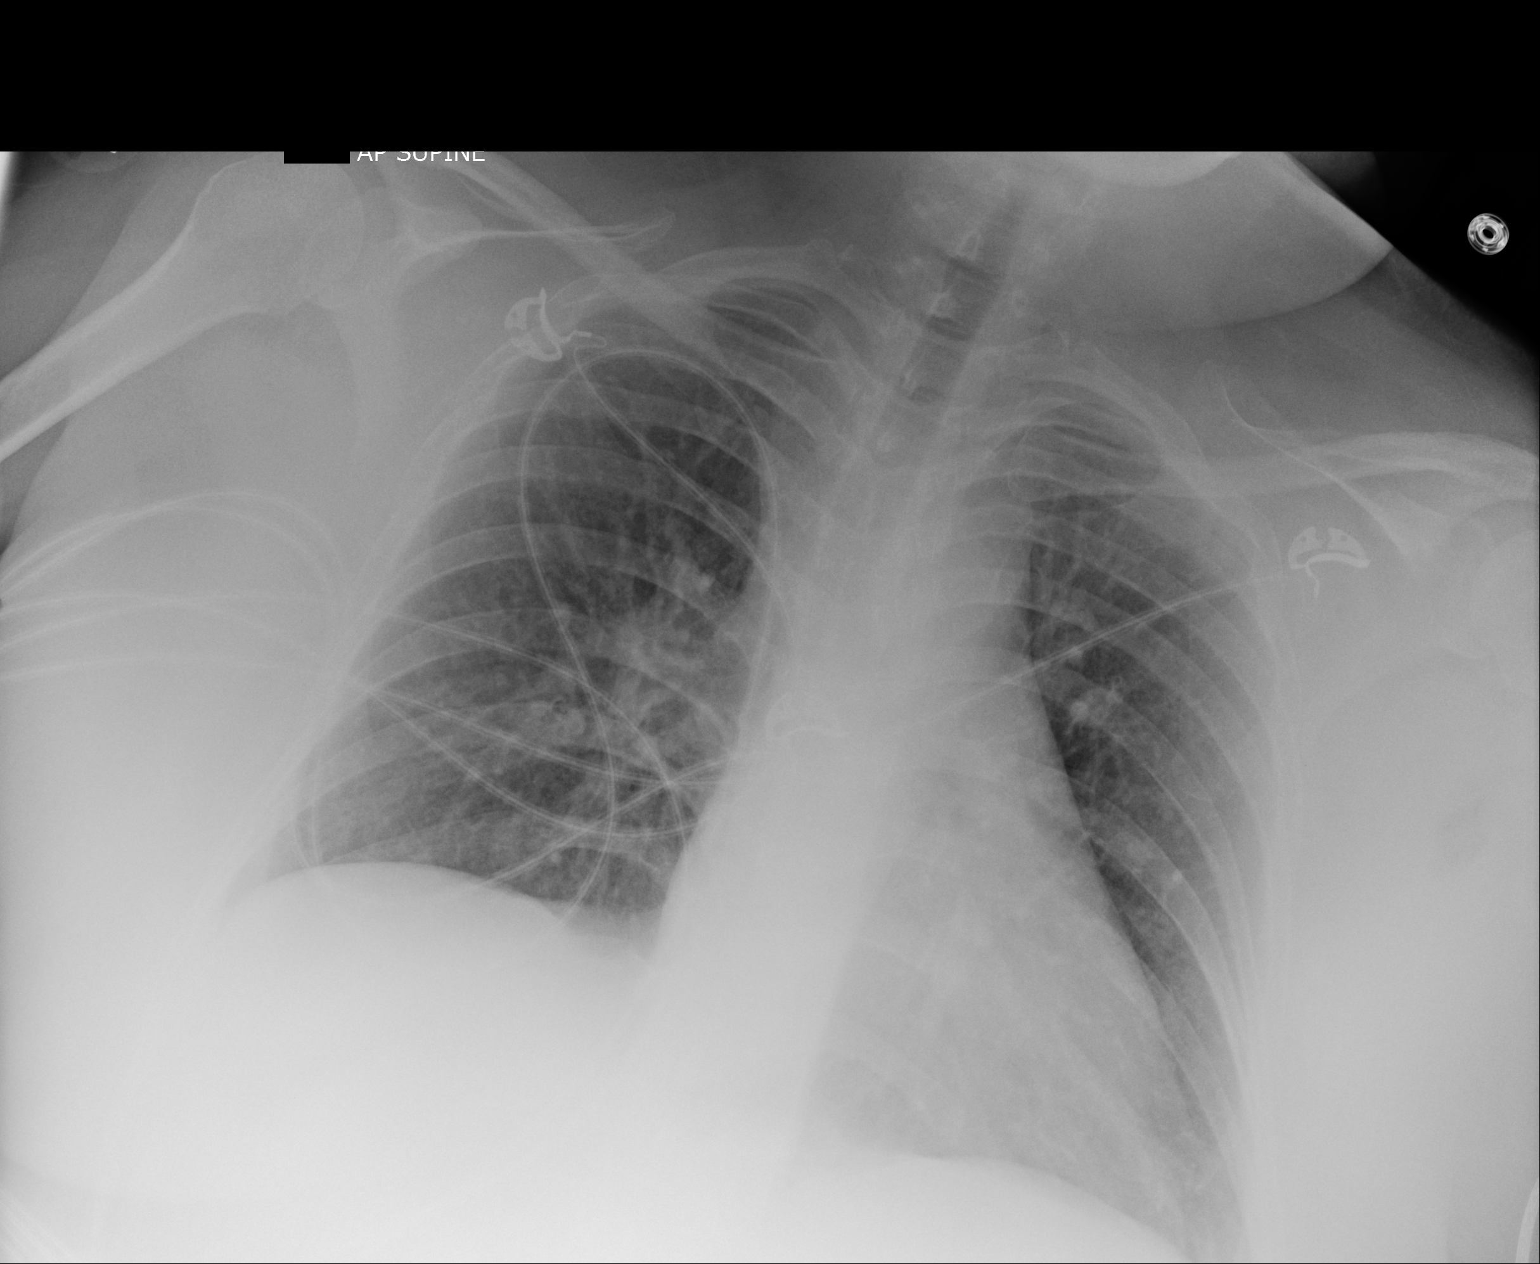

[1 of 1 positions shown; findings below may reference images not displayed]

FINDINGS: The heart size and mediastinal contours are within normal limits.
Both lungs are clear. The visualized skeletal structures are
unremarkable.
IMPRESSION: No active disease.

## 2020-02-26 ENCOUNTER — Other Ambulatory Visit: Payer: Self-pay

## 2020-02-26 ENCOUNTER — Emergency Department (HOSPITAL_COMMUNITY): Payer: Self-pay

## 2020-02-26 ENCOUNTER — Emergency Department (HOSPITAL_COMMUNITY)
Admission: EM | Admit: 2020-02-26 | Discharge: 2020-02-27 | Disposition: A | Discharge status: Home / Self Care | Payer: Self-pay | Attending: Emergency Medicine | Admitting: Emergency Medicine

## 2020-02-26 ENCOUNTER — Encounter (HOSPITAL_COMMUNITY): Payer: Self-pay | Admitting: Emergency Medicine

## 2020-02-26 DIAGNOSIS — R079 Chest pain, unspecified: Secondary | ICD-10-CM

## 2020-02-26 DIAGNOSIS — M549 Dorsalgia, unspecified: Secondary | ICD-10-CM | POA: Insufficient documentation

## 2020-02-26 DIAGNOSIS — R0789 Other chest pain: Secondary | ICD-10-CM

## 2020-02-26 DIAGNOSIS — Z7951 Long term (current) use of inhaled steroids: Secondary | ICD-10-CM | POA: Insufficient documentation

## 2020-02-26 DIAGNOSIS — R0781 Pleurodynia: Secondary | ICD-10-CM

## 2020-02-26 DIAGNOSIS — R071 Chest pain on breathing: Secondary | ICD-10-CM | POA: Insufficient documentation

## 2020-02-26 DIAGNOSIS — R1011 Right upper quadrant pain: Secondary | ICD-10-CM | POA: Insufficient documentation

## 2020-02-26 DIAGNOSIS — Z794 Long term (current) use of insulin: Secondary | ICD-10-CM | POA: Insufficient documentation

## 2020-02-26 DIAGNOSIS — J45901 Unspecified asthma with (acute) exacerbation: Secondary | ICD-10-CM | POA: Insufficient documentation

## 2020-02-26 DIAGNOSIS — E119 Type 2 diabetes mellitus without complications: Secondary | ICD-10-CM | POA: Insufficient documentation

## 2020-02-26 LAB — BASIC METABOLIC PANEL
Anion gap: 12 (ref 5–15)
BUN: 12 mg/dL (ref 6–20)
CO2: 25 mmol/L (ref 22–32)
Calcium: 10.1 mg/dL (ref 8.9–10.3)
Chloride: 96 mmol/L — ABNORMAL LOW (ref 98–111)
Creatinine, Ser: 0.61 mg/dL (ref 0.44–1.00)
GFR calc Af Amer: >60 mL/min (ref >60)
GFR calc non Af Amer: >60 mL/min (ref >60)
Glucose, Bld: 322 mg/dL — ABNORMAL HIGH (ref 70–99)
Potassium: 4.4 mmol/L (ref 3.5–5.1)
Sodium: 133 mmol/L — ABNORMAL LOW (ref 135–145)

## 2020-02-26 LAB — CBC
HCT: 50.1 % — ABNORMAL HIGH (ref 36.0–46.0)
Hemoglobin: 17.1 g/dL — ABNORMAL HIGH (ref 12.0–15.0)
MCH: 30.5 pg (ref 26.0–34.0)
MCHC: 34.1 g/dL (ref 30.0–36.0)
MCV: 89.5 fL (ref 80.0–100.0)
Platelets: 211 10*3/uL (ref 150–400)
RBC: 5.6 MIL/uL — ABNORMAL HIGH (ref 3.87–5.11)
RDW: 12 % (ref 11.5–15.5)
WBC: 7.9 10*3/uL (ref 4.0–10.5)
nRBC: 0 % (ref 0.0–0.2)

## 2020-02-26 LAB — POC URINE PREG, ED: Preg Test, Ur: NEGATIVE

## 2020-02-26 LAB — TROPONIN I (HIGH SENSITIVITY): Troponin I (High Sensitivity): 2 ng/L (ref <18)

## 2020-02-26 MED ORDER — ONDANSETRON HCL 4 MG/2ML IJ SOLN
4.0000 mg | Freq: Once | INTRAMUSCULAR | Status: AC
  Administered 2020-02-26: 4 mg via INTRAVENOUS
  Filled 2020-02-26: qty 2

## 2020-02-26 MED ORDER — SODIUM CHLORIDE 0.9% FLUSH
3.0000 mL | Freq: Once | INTRAVENOUS | Status: AC
  Administered 2020-02-26: 3 mL via INTRAVENOUS

## 2020-02-26 MED ORDER — FENTANYL CITRATE (PF) 100 MCG/2ML IJ SOLN
50.0000 ug | Freq: Once | INTRAMUSCULAR | Status: AC
  Administered 2020-02-26: 50 ug via INTRAVENOUS
  Filled 2020-02-26: qty 2

## 2020-02-26 NOTE — ED Provider Notes (Signed)
[facility] EMERGENCY DEPARTMENT Provider Note   CSN: [id number] Arrival date & time: 02/26/20  2119     History Chief Complaint  Patient presents with  . Chest Pain    Kyliah Talbert Nan is a 49 y.o. female.  Level 5 caveat for language barrier.  Patient with history of diabetes and asthma presenting with right mid back pain that radiates to her chest.  Pain has been ongoing since yesterday morning and progressively worsening.  It is worse with palpation and deep breathing and movement.  Denies any fall or trauma.  She feels her asthma is well controlled and she denies shortness of breath, cough or fever but has felt hot at home.  Pain is worse with palpation and movement.  Is in her right mid back and right ribs and right lateral chest.  Denies abdominal pain.  No vomiting or diarrhea.  No leg pain or leg swelling.  She is never had this kind of pain before.  Still has a gallbladder and appendix. She feels her asthma is well controlled at this time he denies any difficulty breathing or cough.  The history is provided by the patient and a relative. The history is limited by a language barrier. A language interpreter was used.  Chest Pain Associated symptoms: abdominal pain, back pain, cough and shortness of breath   Associated symptoms: no dizziness, no fatigue, no fever, no headache, no nausea, no vomiting and no weakness        Past Medical History:  Diagnosis Date  . Asthma   . Diabetes mellitus without complication [facility])     Patient Active Problem List   Diagnosis Date Noted  . Type II diabetes mellitus, uncontrolled (HCC) 05/25/2018  . Acute respiratory failure with hypoxia (HCC) 05/25/2018  . Acute asthma exacerbation 05/25/2018  . Asthma exacerbation 05/20/2018  . Hyperglycemia 05/20/2018  . Obesity 05/20/2018    Past Surgical History:  Procedure Laterality Date  . CESAREAN SECTION       OB History   No obstetric history on file.     Family History    Problem Relation Age of Onset  . Asthma Mother     Social History   Tobacco Use  . Smoking status: Never Smoker  . Smokeless tobacco: Never Used  Vaping Use  . Vaping Use: Never used  Substance Use Topics  . Alcohol use: Never  . Drug use: Never    Home Medications Prior to Admission medications   Medication Sig Start Date End Date Taking? Authorizing Provider  acetaminophen (TYLENOL) 325 MG tablet Take 2 tablets (650 mg total) by mouth every 6 (six) hours as needed for mild pain (or Fever >/= 101). 01/31/19   Calvert Cantor, MD  albuterol (VENTOLIN HFA) 108 (90 Base) MCG/ACT inhaler Inhale 1-2 puffs into the lungs every 6 (six) hours as needed for wheezing or shortness of breath. 01/31/19   Calvert Cantor, MD  budesonide-formoterol (SYMBICORT) 160-4.5 MCG/ACT inhaler Inhale 2 puffs into the lungs 2 (two) times daily. 01/31/19   Calvert Cantor, MD  chlorpheniramine-HYDROcodone (TUSSIONEX) 10-8 MG/5ML SUER Take 5 mLs by mouth every 12 (twelve) hours. 01/31/19   Calvert Cantor, MD  dextromethorphan-guaiFENesin (MUCINEX DM) 30-600 MG 12hr tablet Take 1 tablet by mouth 2 (two) times daily. 01/31/19   Calvert Cantor, MD  insulin aspart protamine- aspart (NOVOLOG MIX 70/30) (70-30) 100 UNIT/ML injection Inject 0.55 mLs (55 Units total) into the skin 2 (two) times daily with a meal. 01/31/19   Rizwan, [place],  MD  loratadine (CLARITIN) 10 MG tablet Take 10 mg by mouth daily.    [provider]  metFORMIN (GLUCOPHAGE) 500 MG tablet Take 1 tablet (500 mg total) by mouth 2 (two) times daily with a meal. 05/21/18   Elgergawy, Leana Roe, MD  montelukast (SINGULAIR) 10 MG tablet Take 1 tablet (10 mg total) by mouth daily. 01/31/19 01/31/20  Calvert Cantor, MD  predniSONE (DELTASONE) 10 MG tablet Take 6 tablets (60 mg total) by mouth daily with breakfast. Take 6 tabs tomorrow and then decrease by 1 tab daily until finished 01/31/19   Rizwan, Ladell Heads, MD  THEOPHYLLINE CR PO Take 10 mg by mouth daily.    [provider]    Allergies    Patient has no known allergies.  Review of Systems   Review of Systems  Constitutional: Negative for activity change, appetite change, fatigue and fever.  HENT: Negative for congestion and rhinorrhea.   Respiratory: Positive for cough, chest tightness and shortness of breath.   Cardiovascular: Positive for chest pain. Negative for leg swelling.  Gastrointestinal: Positive for abdominal pain. Negative for nausea and vomiting.  Genitourinary: Negative for dysuria and hematuria.  Musculoskeletal: Positive for back pain. Negative for arthralgias and myalgias.  Neurological: Negative for dizziness, weakness and headaches.   all other systems are negative except as noted in the HPI and PMH.    Physical Exam Updated Vital Signs BP (!) 177/99   Pulse 79   Temp (!) 97.2 F (36.2 C) (Oral)   Resp 19   Ht 5' (1.524 m)   Wt 73 kg   SpO2 99%   BMI 31.43 kg/m   Physical Exam Vitals and nursing note reviewed.  Constitutional:      General: She is not in acute distress.    Appearance: She is well-developed. She is obese.  HENT:     Head: Normocephalic and atraumatic.     Mouth/Throat:     Pharynx: No oropharyngeal exudate.  Eyes:     Conjunctiva/sclera: Conjunctivae normal.     Pupils: Pupils are equal, round, and reactive to light.  Neck:     Comments: No meningismus. Cardiovascular:     Rate and Rhythm: Normal rate and regular rhythm.     Heart sounds: Normal heart sounds. No murmur heard.   Pulmonary:     Effort: Pulmonary effort is normal. No respiratory distress.     Breath sounds: Normal breath sounds.       Comments: R mid back tenderness. R lateral rib tenderness No rash Chest:     Chest wall: Tenderness present.  Abdominal:     Palpations: Abdomen is soft.     Tenderness: There is abdominal tenderness. There is no guarding or rebound.     Comments: Mild RUQ tenderness. No guarding or rebound  Musculoskeletal:        General: No  tenderness. Normal range of motion.     Cervical back: Normal range of motion and neck supple.  Skin:    General: Skin is warm.  Neurological:     Mental Status: She is alert and oriented to person, place, and time.     Cranial Nerves: No cranial nerve deficit.     Motor: No abnormal muscle tone.     Coordination: Coordination normal.     Comments: No ataxia on finger to nose bilaterally. No pronator drift. 5/5 strength throughout. CN 2-12 intact.Equal grip strength. Sensation intact.   Psychiatric:  Behavior: Behavior normal.     ED Results / Procedures / Treatments   Labs (all labs ordered are listed, but only abnormal results are displayed) Labs Reviewed  BASIC METABOLIC PANEL - Abnormal; Notable for the following components:      Result Value   Sodium 133 (*)    Chloride 96 (*)    Glucose, Bld 322 (*)    All other components within normal limits  CBC - Abnormal; Notable for the following components:   RBC 5.60 (*)    Hemoglobin 17.1 (*)    HCT 50.1 (*)    All other components within normal limits  HEPATIC FUNCTION PANEL - Abnormal; Notable for the following components:   Total Protein 8.7 (*)    All other components within normal limits  LIPASE, BLOOD - Abnormal; Notable for the following components:   Lipase 55 (*)    All other components within normal limits  CBG MONITORING, ED - Abnormal; Notable for the following components:   Glucose-Capillary 341 (*)    All other components within normal limits  D-DIMER, QUANTITATIVE (NOT AT [facility])  POC URINE PREG, ED  TROPONIN I (HIGH SENSITIVITY)  TROPONIN I (HIGH SENSITIVITY)    EKG EKG Interpretation  Date/Time:  Thursday February 26 2020 22:34:16 EDT Ventricular Rate:  76 PR Interval:    QRS Duration: 90 QT Interval:  384 QTC Calculation: 432 R Axis:   -4 Text Interpretation: Sinus rhythm Probable anteroseptal infarct, old No significant change was found Confirmed by Glynn Octave [phone]) on 02/26/2020 11:16:21  PM   Radiology CT ABDOMEN PELVIS W CONTRAST  Result Date: 02/27/2020 CLINICAL DATA:  Right upper quadrant pain, no fever or elevated leukocyte ptosis EXAM: CT ABDOMEN AND PELVIS WITH CONTRAST TECHNIQUE: Multidetector CT imaging of the abdomen and pelvis was performed using the standard protocol following bolus administration of intravenous contrast. CONTRAST:  OMNIPAQUE IOHEXOL 300 MG/ML  SOLN COMPARISON:  Chest radiograph 02/26/2020 FINDINGS: Lower chest: Atelectatic changes in the otherwise clear lung bases. Hepatobiliary: Diffuse hepatic hypoattenuation compatible with hepatic steatosis. Sparing along the gallbladder fossa. No focal liver abnormality is seen. No gallstones, gallbladder wall thickening, or biliary dilatation. Steatosis sparing seen along the gallbladder fossa. No concerning focal liver lesions. Smooth liver surface contour. Normal gallbladder. No visible calcified gallstones or biliary ductal dilatation. Pancreas: Unremarkable. No pancreatic ductal dilatation or surrounding inflammatory changes. Spleen: Normal in size without focal abnormality. Adrenals/Urinary Tract: Normal adrenal glands. Kidneys are normally located with symmetric, uniform enhancementand excretion. Indeterminate, enhancing lesion in the interpolar left kidney measuring 2.4 by 2.9 by 2.4 cm with slight washout on excretory phase delay which is highly suspicious for a renal cell carcinoma. Small 12 mm fluid attenuation cysts seen in the right kidney (7/26). Additional scattered subcentimeter hypertension foci in both kidneys too small to fully characterize on CT imaging but statistically likely benign. No obstructive urolithiasis or hydronephrosis. No gross bladder abnormality. Stomach/Bowel: Distal esophagus, stomach and duodenal sweep are unremarkable. No small bowel wall thickening or dilatation. Extensive distal small bowel fecalization without evidence of mechanical obstruction. A normal appendix is visualized.  No colonic dilatation or wall thickening. Vascular/Lymphatic: Atherosclerotic calcifications within the abdominal aorta and branch vessels. No aneurysm or ectasia. No enlarged abdominopelvic lymph nodes. Reproductive: Uterus and bilateral adnexa are unremarkable. Other: No abdominopelvic free fluid or free gas. No bowel containing hernias. Musculoskeletal: Multilevel degenerative changes are present in the imaged portions of the spine. Additional degenerative changes in the SI joints and mildly within  the hips. Enthesopathy about the pelvis as well. No acute osseous abnormality or suspicious osseous lesion. IMPRESSION: 1. Irregular enhancing lesion in the interpolar left kidney 2.4 x 2.9 x 2.4 cm with slight washout on excretory phase delay. Highly suspicious for a renal cell carcinoma until proven otherwise. 2. Extensive distal small bowel fecalization without evidence of mechanical obstruction. Findings are nonspecific but can be seen with slowed intestinal transit/constipation. Correlate with clinical features. 3. No other acute abdominopelvic abnormality to provide cause for patient's symptoms. 4. Hepatic steatosis. 5. Aortic Atherosclerosis (ICD10-I70.0). These results were called by telephone at the time of interpretation on 02/27/2020 at 2:25 am to provider [facility] , who verbally acknowledged these results. Electronically Signed   By: Lovena Le M.D.   On: 02/27/2020 02:21   DG Chest Port 1 View  Result Date: 02/26/2020 CLINICAL DATA:  Pain with inspiration, history of asthma EXAM: PORTABLE CHEST 1 VIEW COMPARISON:  01/28/2019 FINDINGS: Single frontal view of the chest demonstrates a stable cardiac silhouette. There is chronic elevation of the right hemidiaphragm. Allowing for differences in technique, there is no significant change since prior studies. No airspace disease, effusion, or pneumothorax. IMPRESSION: 1. Stable exam, no acute process. Electronically Signed   By: Randa Ngo M.D.    On: 02/26/2020 23:22    Procedures Procedures (including critical care time)  Medications Ordered in ED Medications  fentaNYL (SUBLIMAZE) injection 50 mcg (has no administration in time range)  ondansetron (ZOFRAN) injection 4 mg (has no administration in time range)  sodium chloride flush (NS) 0.9 % injection 3 mL (3 mLs Intravenous Given by Other 02/26/20 2242)    ED Course  I have reviewed the triage vital signs and the nursing notes.  Pertinent labs & imaging results that were available during my care of the patient were reviewed by me and considered in my medical decision making (see chart for details).    MDM Rules/Calculators/A&P                         R mid back tenderness radiating to chest. Reproducible with tenderness and movement.   EKG no acute ischemia.  Troponin negative.  Labs show hyperglycemia without DKA.  Pain is reproducible to palpation of her right mid back and right lateral ribs.  Chest x-ray is negative.  D-dimer is negative.  Low suspicion for pulmonary embolism, ACS, aortic dissection.  Ultrasound is not available.  CT scan obtained to evaluate for gallbladder pathology.  No gallstones seen.  Incidental finding noted of left renal mass concerning for carcinoma.  Discussed with Dr. Lovena Neighbours of urology who agrees with outpatient follow-up.  Results discussed with patient.   Will arrange for gallbladder ultrasound in the morning.  Discussed her renal mass and need for follow-up with urology given concern for carcinoma.  Troponin negative x2 with low suspicion for ACS.  Pain across her right side is reproducible worse with palpation and movement.  Early zoster considered as well without rash seen at this time. LFTs and lipase are normal.  Ultrasound gallbladder will be obtained in the morning.  Follow-up with urology regarding her renal mass.  Return precautions discussed Final Clinical Impression(s) / ED Diagnoses Final diagnoses:  Atypical chest pain    Pleuritic pain    Rx / DC Orders ED Discharge Orders    None       Nahiara Kretzschmar, Annie Main, MD 02/27/20 0400

## 2020-02-26 NOTE — ED Triage Notes (Signed)
Pt c/o chest pain since this morning. Pain increases when she takes a breath.

## 2020-02-27 ENCOUNTER — Emergency Department (HOSPITAL_COMMUNITY): Admission: EM | Admit: 2020-02-27 | Discharge: 2020-02-27 | Discharge status: Absent without leave | Payer: Self-pay

## 2020-02-27 ENCOUNTER — Emergency Department (HOSPITAL_COMMUNITY): Payer: Self-pay

## 2020-02-27 ENCOUNTER — Other Ambulatory Visit: Payer: Self-pay

## 2020-02-27 LAB — HEPATIC FUNCTION PANEL
ALT: 29 U/L (ref 0–44)
AST: 25 U/L (ref 15–41)
Albumin: 4.4 g/dL (ref 3.5–5.0)
Alkaline Phosphatase: 65 U/L (ref 38–126)
Bilirubin, Direct: 0.2 mg/dL (ref 0.0–0.2)
Indirect Bilirubin: 0.6 mg/dL (ref 0.3–0.9)
Total Bilirubin: 0.8 mg/dL (ref 0.3–1.2)
Total Protein: 8.7 g/dL — ABNORMAL HIGH (ref 6.5–8.1)

## 2020-02-27 LAB — LIPASE, BLOOD: Lipase: 55 U/L — ABNORMAL HIGH (ref 11–51)

## 2020-02-27 LAB — TROPONIN I (HIGH SENSITIVITY): Troponin I (High Sensitivity): <2 ng/L (ref <18)

## 2020-02-27 LAB — CBG MONITORING, ED: Glucose-Capillary: 341 mg/dL — ABNORMAL HIGH (ref 70–99)

## 2020-02-27 LAB — D-DIMER, QUANTITATIVE: D-Dimer, Quant: 0.34 ug/mL-FEU (ref 0.00–0.50)

## 2020-02-27 MED ORDER — IOHEXOL 300 MG/ML  SOLN
100.0000 mL | Freq: Once | INTRAMUSCULAR | Status: AC | PRN
  Administered 2020-02-27: 100 mL via INTRAVENOUS

## 2020-02-27 MED ORDER — OMEPRAZOLE 20 MG PO CPDR: 20.0000 mg | DELAYED_RELEASE_CAPSULE | Freq: Every day | ORAL | 0 refills | Status: AC

## 2020-02-27 MED ORDER — NAPROXEN 500 MG PO TABS: 500.0000 mg | ORAL_TABLET | Freq: Two times a day (BID) | ORAL | 0 refills | Status: AC | PRN

## 2020-02-27 NOTE — Discharge Instructions (Signed)
Your testing is negative for heart attack or blood clot in the lung.  Follow-up tomorrow for an ultrasound of your gallbladder.  You should also follow-up with the urologist regarding the mass on your kidney which is concerning for something cancerous. Return to the ED if your chest pain becomes exertional, associated with shortness of breath, nausea cough, any other concerns.

## 2020-03-19 ENCOUNTER — Other Ambulatory Visit: Payer: Self-pay

## 2020-03-19 ENCOUNTER — Encounter (HOSPITAL_COMMUNITY): Payer: Self-pay

## 2020-03-19 ENCOUNTER — Emergency Department (HOSPITAL_COMMUNITY)
Admission: EM | Admit: 2020-03-19 | Discharge: 2020-03-19 | Disposition: A | Discharge status: Home / Self Care | Payer: Self-pay

## 2020-03-19 NOTE — ED Triage Notes (Signed)
Pt reports coming to ER for kidney biopsy results to check for kidney cancer.   Denies any other complaint or symptoms.

## 2020-03-19 NOTE — ED Provider Notes (Signed)
Patient presents into the emergency room questioning what her next steps are in evaluation of her possible kidney cancer.  She only speaks Spanish and tells me that she was directed from the front to the hospital to another place and they told her she would need to go to the ER.  She denies any complaints or concerns, says she did not come here trying to be seen in the emergency room, rather was trying to figure out what her next steps were in her evaluation.  It appears that through her language barrier she accidentally ended up in the emergency room.  Using a translator I went over her instructions on the discharge paperwork from her previous visit that she brings back, she is aware that she needs to call both urology and the number to set up a appointment for her call bladder ultrasound.  She denies any pain or need for current evaluation by a provider, says she does not want to be seen in the ER today.  We will ask registration to delete the encounter as this appears to be a misunderstanding due to patient's language barrier. Charge RN is aware.   NO CHARGE!   Cristina Gong, PA-C 03/19/20 2246    Derwood Kaplan, MD 03/20/20 [phone]

## 2020-03-19 NOTE — ED Notes (Signed)
Pt was not needing an ER visit. Pt states that she was supposed to have a follow up and did not mean to be in the ER. Pt only speak spanish and could not understand why she is in ER.

## 2021-08-10 ENCOUNTER — Inpatient Hospital Stay (HOSPITAL_COMMUNITY)
Admission: EM | Admit: 2021-08-10 | Discharge: 2021-08-13 | DRG: 690 | Disposition: A | Discharge status: Home / Self Care | Payer: Self-pay | Attending: Internal Medicine | Admitting: Internal Medicine

## 2021-08-10 ENCOUNTER — Other Ambulatory Visit: Payer: Self-pay

## 2021-08-10 ENCOUNTER — Emergency Department (HOSPITAL_COMMUNITY): Payer: Self-pay

## 2021-08-10 DIAGNOSIS — J45909 Unspecified asthma, uncomplicated: Secondary | ICD-10-CM | POA: Diagnosis present

## 2021-08-10 DIAGNOSIS — E119 Type 2 diabetes mellitus without complications: Secondary | ICD-10-CM

## 2021-08-10 DIAGNOSIS — B9620 Unspecified Escherichia coli [E. coli] as the cause of diseases classified elsewhere: Secondary | ICD-10-CM | POA: Diagnosis present

## 2021-08-10 DIAGNOSIS — R112 Nausea with vomiting, unspecified: Secondary | ICD-10-CM

## 2021-08-10 DIAGNOSIS — R6510 Systemic inflammatory response syndrome (SIRS) of non-infectious origin without acute organ dysfunction: Secondary | ICD-10-CM | POA: Diagnosis present

## 2021-08-10 DIAGNOSIS — Z20822 Contact with and (suspected) exposure to covid-19: Secondary | ICD-10-CM | POA: Diagnosis present

## 2021-08-10 DIAGNOSIS — Z28310 Unvaccinated for covid-19: Secondary | ICD-10-CM

## 2021-08-10 DIAGNOSIS — Z79899 Other long term (current) drug therapy: Secondary | ICD-10-CM

## 2021-08-10 DIAGNOSIS — R109 Unspecified abdominal pain: Secondary | ICD-10-CM

## 2021-08-10 DIAGNOSIS — E1165 Type 2 diabetes mellitus with hyperglycemia: Secondary | ICD-10-CM | POA: Diagnosis present

## 2021-08-10 DIAGNOSIS — N10 Acute tubulo-interstitial nephritis: Principal | ICD-10-CM | POA: Diagnosis present

## 2021-08-10 DIAGNOSIS — N2889 Other specified disorders of kidney and ureter: Secondary | ICD-10-CM

## 2021-08-10 DIAGNOSIS — N12 Tubulo-interstitial nephritis, not specified as acute or chronic: Secondary | ICD-10-CM

## 2021-08-10 DIAGNOSIS — E860 Dehydration: Secondary | ICD-10-CM

## 2021-08-10 DIAGNOSIS — Z7951 Long term (current) use of inhaled steroids: Secondary | ICD-10-CM

## 2021-08-10 DIAGNOSIS — Z603 Acculturation difficulty: Secondary | ICD-10-CM | POA: Diagnosis present

## 2021-08-10 DIAGNOSIS — Z825 Family history of asthma and other chronic lower respiratory diseases: Secondary | ICD-10-CM

## 2021-08-10 DIAGNOSIS — E441 Mild protein-calorie malnutrition: Secondary | ICD-10-CM | POA: Diagnosis present

## 2021-08-10 DIAGNOSIS — R739 Hyperglycemia, unspecified: Secondary | ICD-10-CM

## 2021-08-10 DIAGNOSIS — E871 Hypo-osmolality and hyponatremia: Secondary | ICD-10-CM | POA: Diagnosis present

## 2021-08-10 DIAGNOSIS — Z7984 Long term (current) use of oral hypoglycemic drugs: Secondary | ICD-10-CM

## 2021-08-10 DIAGNOSIS — R1030 Lower abdominal pain, unspecified: Secondary | ICD-10-CM

## 2021-08-10 LAB — CBC WITH DIFFERENTIAL/PLATELET
Abs Immature Granulocytes: 0.1 10*3/uL — ABNORMAL HIGH (ref 0.00–0.07)
Basophils Absolute: 0.1 10*3/uL (ref 0.0–0.1)
Basophils Relative: 0 %
Eosinophils Absolute: 0 10*3/uL (ref 0.0–0.5)
Eosinophils Relative: 0 %
HCT: 39.6 % (ref 36.0–46.0)
Hemoglobin: 13.6 g/dL (ref 12.0–15.0)
Immature Granulocytes: 1 %
Lymphocytes Relative: 7 %
Lymphs Abs: 1.2 10*3/uL (ref 0.7–4.0)
MCH: 31.1 pg (ref 26.0–34.0)
MCHC: 34.3 g/dL (ref 30.0–36.0)
MCV: 90.4 fL (ref 80.0–100.0)
Monocytes Absolute: 1.2 10*3/uL — ABNORMAL HIGH (ref 0.1–1.0)
Monocytes Relative: 7 %
Neutro Abs: 15.2 10*3/uL — ABNORMAL HIGH (ref 1.7–7.7)
Neutrophils Relative %: 85 %
Platelets: 168 10*3/uL (ref 150–400)
RBC: 4.38 MIL/uL (ref 3.87–5.11)
RDW: 11.7 % (ref 11.5–15.5)
WBC: 17.8 10*3/uL — ABNORMAL HIGH (ref 4.0–10.5)
nRBC: 0 % (ref 0.0–0.2)

## 2021-08-10 LAB — COMPREHENSIVE METABOLIC PANEL
ALT: 11 U/L (ref 0–44)
AST: 11 U/L — ABNORMAL LOW (ref 15–41)
Albumin: 3.3 g/dL — ABNORMAL LOW (ref 3.5–5.0)
Alkaline Phosphatase: 47 U/L (ref 38–126)
Anion gap: 9 (ref 5–15)
BUN: 12 mg/dL (ref 6–20)
CO2: 22 mmol/L (ref 22–32)
Calcium: 8.3 mg/dL — ABNORMAL LOW (ref 8.9–10.3)
Chloride: 96 mmol/L — ABNORMAL LOW (ref 98–111)
Creatinine, Ser: 0.73 mg/dL (ref 0.44–1.00)
GFR, Estimated: >60 mL/min (ref >60)
Glucose, Bld: 298 mg/dL — ABNORMAL HIGH (ref 70–99)
Potassium: 3.9 mmol/L (ref 3.5–5.1)
Sodium: 127 mmol/L — ABNORMAL LOW (ref 135–145)
Total Bilirubin: 1 mg/dL (ref 0.3–1.2)
Total Protein: 6.8 g/dL (ref 6.5–8.1)

## 2021-08-10 LAB — LIPASE, BLOOD: Lipase: 29 U/L (ref 11–51)

## 2021-08-10 LAB — RESP PANEL BY RT-PCR (FLU A&B, COVID) ARPGX2
Influenza A by PCR: NEGATIVE
Influenza B by PCR: NEGATIVE
SARS Coronavirus 2 by RT PCR: NEGATIVE

## 2021-08-10 LAB — HCG, SERUM, QUALITATIVE: Preg, Serum: NEGATIVE

## 2021-08-10 MED ORDER — IOHEXOL 300 MG/ML  SOLN
100.0000 mL | Freq: Once | INTRAMUSCULAR | Status: AC | PRN
  Administered 2021-08-11: 100 mL via INTRAVENOUS

## 2021-08-10 MED ORDER — ONDANSETRON HCL 4 MG/2ML IJ SOLN
4.0000 mg | Freq: Once | INTRAMUSCULAR | Status: AC
  Administered 2021-08-10: 4 mg via INTRAVENOUS
  Filled 2021-08-10: qty 2

## 2021-08-10 MED ORDER — ACETAMINOPHEN 500 MG PO TABS
1000.0000 mg | ORAL_TABLET | Freq: Once | ORAL | Status: AC
  Administered 2021-08-10: 1000 mg via ORAL
  Filled 2021-08-10: qty 2

## 2021-08-10 MED ORDER — MORPHINE SULFATE (PF) 4 MG/ML IV SOLN
4.0000 mg | Freq: Once | INTRAVENOUS | Status: AC
  Administered 2021-08-10: 4 mg via INTRAVENOUS
  Filled 2021-08-10: qty 1

## 2021-08-10 MED ORDER — SODIUM CHLORIDE 0.9 % IV BOLUS
1000.0000 mL | Freq: Once | INTRAVENOUS | Status: AC
  Administered 2021-08-10: 1000 mL via INTRAVENOUS

## 2021-08-10 NOTE — ED Provider Notes (Signed)
[facility] EMERGENCY DEPARTMENT Provider Note   CSN: [id number] Arrival date & time: 08/10/21  1924     History Chief Complaint  Patient presents with   Abdominal Pain    Joshlyn Talbert Nan is a 50 y.o. female.  Pt presents to the ED today with lower abdominal pain and vomiting which started at 0600.  Pt has a fever, but she is  unaware she had one.  She has never had any pain like this in the past.  Due to language barrier, an interpreter was present during the history-taking and subsequent discussion (and for part of the physical exam) with this patient.       Past Medical History:  Diagnosis Date   Asthma    Diabetes mellitus without complication [facility])     Patient Active Problem List   Diagnosis Date Noted   Type II diabetes mellitus, uncontrolled 05/25/2018   Acute respiratory failure with hypoxia (HCC) 05/25/2018   Acute asthma exacerbation 05/25/2018   Asthma exacerbation 05/20/2018   Hyperglycemia 05/20/2018   Obesity 05/20/2018    Past Surgical History:  Procedure Laterality Date   CESAREAN SECTION       OB History   No obstetric history on file.     Family History  Problem Relation Age of Onset   Asthma Mother     Social History   Tobacco Use   Smoking status: Never   Smokeless tobacco: Never  Vaping Use   Vaping Use: Never used  Substance Use Topics   Alcohol use: Never   Drug use: Never    Home Medications Prior to Admission medications   Medication Sig Start Date End Date Taking? Authorizing Provider  acetaminophen (TYLENOL) 325 MG tablet Take 2 tablets (650 mg total) by mouth every 6 (six) hours as needed for mild pain (or Fever >/= 101). 01/31/19   Calvert Cantor, MD  albuterol (VENTOLIN HFA) 108 (90 Base) MCG/ACT inhaler Inhale 1-2 puffs into the lungs every 6 (six) hours as needed for wheezing or shortness of breath. 01/31/19   Calvert Cantor, MD  budesonide-formoterol (SYMBICORT) 160-4.5 MCG/ACT inhaler Inhale 2 puffs into the  lungs 2 (two) times daily. 01/31/19   Calvert Cantor, MD  chlorpheniramine-HYDROcodone (TUSSIONEX) 10-8 MG/5ML SUER Take 5 mLs by mouth every 12 (twelve) hours. 01/31/19   Calvert Cantor, MD  dextromethorphan-guaiFENesin (MUCINEX DM) 30-600 MG 12hr tablet Take 1 tablet by mouth 2 (two) times daily. 01/31/19   Calvert Cantor, MD  insulin aspart protamine- aspart (NOVOLOG MIX 70/30) (70-30) 100 UNIT/ML injection Inject 0.55 mLs (55 Units total) into the skin 2 (two) times daily with a meal. 01/31/19   Calvert Cantor, MD  loratadine (CLARITIN) 10 MG tablet Take 10 mg by mouth daily.    [provider]  metFORMIN (GLUCOPHAGE) 500 MG tablet Take 1 tablet (500 mg total) by mouth 2 (two) times daily with a meal. 05/21/18   Elgergawy, Leana Roe, MD  montelukast (SINGULAIR) 10 MG tablet Take 1 tablet (10 mg total) by mouth daily. 01/31/19 01/31/20  Calvert Cantor, MD  naproxen (NAPROSYN) 500 MG tablet Take 1 tablet (500 mg total) by mouth 2 (two) times daily as needed. 02/27/20   Rancour, Jeannett Senior, MD  omeprazole (PRILOSEC) 20 MG capsule Take 1 capsule (20 mg total) by mouth daily. 02/27/20   Rancour, Jeannett Senior, MD  predniSONE (DELTASONE) 10 MG tablet Take 6 tablets (60 mg total) by mouth daily with breakfast. Take 6 tabs tomorrow and then decrease by 1 tab daily until  finished 01/31/19   Calvert Cantor, MD  THEOPHYLLINE CR PO Take 10 mg by mouth daily.    [provider]    Allergies    Patient has no known allergies.  Review of Systems   Review of Systems  Gastrointestinal:  Positive for abdominal pain, nausea and vomiting.  All other systems reviewed and are negative.  Physical Exam Updated Vital Signs BP 106/77   Pulse 94   Temp (!) 101 F (38.3 C) (Oral)   Resp 20   SpO2 98%   Physical Exam Vitals and nursing note reviewed.  Constitutional:      Appearance: She is well-developed.  HENT:     Head: Normocephalic and atraumatic.     Mouth/Throat:     Mouth: Mucous membranes are dry.   Eyes:     Extraocular Movements: Extraocular movements intact.     Pupils: Pupils are equal, round, and reactive to light.  Cardiovascular:     Rate and Rhythm: Regular rhythm. Tachycardia present.     Heart sounds: Normal heart sounds.  Pulmonary:     Effort: Pulmonary effort is normal.     Breath sounds: Normal breath sounds.  Abdominal:     General: Abdomen is flat.     Palpations: Abdomen is soft.     Tenderness: There is abdominal tenderness in the right lower quadrant and left lower quadrant.  Skin:    General: Skin is warm.     Capillary Refill: Capillary refill takes less than 2 seconds.  Neurological:     General: No focal deficit present.     Mental Status: She is alert and oriented to person, place, and time.  Psychiatric:        Mood and Affect: Mood normal.        Behavior: Behavior normal.    ED Results / Procedures / Treatments   Labs (all labs ordered are listed, but only abnormal results are displayed) Labs Reviewed  CBC WITH DIFFERENTIAL/PLATELET - Abnormal; Notable for the following components:      Result Value   WBC 17.8 (*)    Neutro Abs 15.2 (*)    Monocytes Absolute 1.2 (*)    Abs Immature Granulocytes 0.10 (*)    All other components within normal limits  COMPREHENSIVE METABOLIC PANEL - Abnormal; Notable for the following components:   Sodium 127 (*)    Chloride 96 (*)    Glucose, Bld 298 (*)    Calcium 8.3 (*)    Albumin 3.3 (*)    AST 11 (*)    All other components within normal limits  RESP PANEL BY RT-PCR (FLU A&B, COVID) ARPGX2  LIPASE, BLOOD  HCG, SERUM, QUALITATIVE  URINALYSIS, ROUTINE W REFLEX MICROSCOPIC    EKG EKG Interpretation  Date/Time:  Wednesday August 10 2021 19:47:50 EST Ventricular Rate:  111 PR Interval:  142 QRS Duration: 68 QT Interval:  326 QTC Calculation: 443 R Axis:   -31 Text Interpretation: Sinus tachycardia Left axis deviation Septal infarct , age undetermined Abnormal ECG Since last tracing rate  faster Confirmed by Jacalyn Lefevre [phone]) on 08/10/2021 9:14:05 PM  Radiology No results found.  Procedures Procedures   Medications Ordered in ED Medications  iohexol (OMNIPAQUE) 300 MG/ML solution 100 mL (has no administration in time range)  sodium chloride 0.9 % bolus 1,000 mL (1,000 mLs Intravenous New Bag/Given 08/10/21 2105)  ondansetron (ZOFRAN) injection 4 mg (4 mg Intravenous Given 08/10/21 2106)  morphine 4 MG/ML injection 4 mg (4 mg  Intravenous Given 08/10/21 2106)  acetaminophen (TYLENOL) tablet 1,000 mg (1,000 mg Oral Given 08/10/21 2106)    ED Course  I have reviewed the triage vital signs and the nursing notes.  Pertinent labs & imaging results that were available during my care of the patient were reviewed by me and considered in my medical decision making (see chart for details).    MDM Rules/Calculators/A&P                           Pt's labs show an elevated WBC.  Glc is elevated, but she has a hx of dm.  Flu/covid neg. Pt's CT and urine are pending at shift change.  Pt signed out to Dr. Pilar Plate at shift change.  Final Clinical Impression(s) / ED Diagnoses Final diagnoses:  Lower abdominal pain  Nausea and vomiting, unspecified vomiting type  Dehydration  Hyperglycemia    Rx / DC Orders ED Discharge Orders     None        Jacalyn Lefevre, MD 08/10/21 2329

## 2021-08-10 NOTE — ED Triage Notes (Addendum)
Presents to hospital for abd pain (generalized), emesis (x4 today), fever. Started at 6am. H/o asthma Denies CP, blood in stool. Last BM 2 days ago. Last intake yesterday States pain is relieved intermittently after vomiting

## 2021-08-11 ENCOUNTER — Inpatient Hospital Stay (HOSPITAL_COMMUNITY): Payer: Self-pay

## 2021-08-11 DIAGNOSIS — R6510 Systemic inflammatory response syndrome (SIRS) of non-infectious origin without acute organ dysfunction: Secondary | ICD-10-CM | POA: Diagnosis present

## 2021-08-11 DIAGNOSIS — N12 Tubulo-interstitial nephritis, not specified as acute or chronic: Secondary | ICD-10-CM | POA: Diagnosis present

## 2021-08-11 DIAGNOSIS — E119 Type 2 diabetes mellitus without complications: Secondary | ICD-10-CM

## 2021-08-11 DIAGNOSIS — N10 Acute tubulo-interstitial nephritis: Secondary | ICD-10-CM | POA: Diagnosis present

## 2021-08-11 LAB — URINALYSIS, MICROSCOPIC (REFLEX)
Squamous Epithelial / HPF: NONE SEEN (ref 0–5)
WBC, UA: >50 WBC/hpf (ref 0–5)

## 2021-08-11 LAB — URINALYSIS, ROUTINE W REFLEX MICROSCOPIC
Bilirubin Urine: NEGATIVE
Glucose, UA: >=500 mg/dL — AB
Ketones, ur: 15 mg/dL — AB
Leukocytes,Ua: NEGATIVE
Nitrite: NEGATIVE
Protein, ur: 100 mg/dL — AB
Specific Gravity, Urine: 1.015 (ref 1.005–1.030)
pH: 6 (ref 5.0–8.0)

## 2021-08-11 LAB — LACTIC ACID, PLASMA: Lactic Acid, Venous: 1.4 mmol/L (ref 0.5–1.9)

## 2021-08-11 LAB — HEMOGLOBIN A1C
Hgb A1c MFr Bld: 11.8 % — ABNORMAL HIGH (ref 4.8–5.6)
Mean Plasma Glucose: 291.96 mg/dL

## 2021-08-11 LAB — GLUCOSE, CAPILLARY
Glucose-Capillary: 356 mg/dL — ABNORMAL HIGH (ref 70–99)
Glucose-Capillary: 284 mg/dL — ABNORMAL HIGH (ref 70–99)
Glucose-Capillary: 111 mg/dL — ABNORMAL HIGH (ref 70–99)
Glucose-Capillary: 310 mg/dL — ABNORMAL HIGH (ref 70–99)
Glucose-Capillary: 308 mg/dL — ABNORMAL HIGH (ref 70–99)

## 2021-08-11 LAB — HIV ANTIBODY (ROUTINE TESTING W REFLEX): HIV Screen 4th Generation wRfx: NONREACTIVE

## 2021-08-11 MED ORDER — SODIUM CHLORIDE 0.9 % IV SOLN: 1.0000 g | INTRAVENOUS | Status: DC

## 2021-08-11 MED ORDER — SODIUM CHLORIDE 0.9 % IV SOLN: 2.0000 g | INTRAVENOUS | Status: DC

## 2021-08-11 MED ORDER — ACETAMINOPHEN 650 MG RE SUPP: 650.0000 mg | Freq: Four times a day (QID) | RECTAL | Status: DC | PRN

## 2021-08-11 MED ORDER — DM-GUAIFENESIN ER 30-600 MG PO TB12: 1.0000 | ORAL_TABLET | Freq: Two times a day (BID) | ORAL | Status: DC

## 2021-08-11 MED ORDER — SODIUM CHLORIDE 0.9 % IV SOLN
2.0000 g | Freq: Once | INTRAVENOUS | Status: AC
  Administered 2021-08-11: 2 g via INTRAVENOUS
  Filled 2021-08-11: qty 20

## 2021-08-11 MED ORDER — ONDANSETRON HCL 4 MG/2ML IJ SOLN
4.0000 mg | Freq: Four times a day (QID) | INTRAMUSCULAR | Status: DC | PRN
  Administered 2021-08-11: 4 mg via INTRAVENOUS
  Filled 2021-08-11: qty 2

## 2021-08-11 MED ORDER — ONDANSETRON HCL 4 MG PO TABS: 4.0000 mg | ORAL_TABLET | Freq: Four times a day (QID) | ORAL | Status: DC | PRN

## 2021-08-11 MED ORDER — OXYCODONE HCL 5 MG PO TABS: 5.0000 mg | ORAL_TABLET | ORAL | Status: DC | PRN

## 2021-08-11 MED ORDER — SODIUM CHLORIDE 0.9 % IV SOLN: INTRAVENOUS | Status: DC

## 2021-08-11 MED ORDER — SODIUM CHLORIDE 0.9 % IV SOLN
2.0000 g | Freq: Three times a day (TID) | INTRAVENOUS | Status: DC
  Administered 2021-08-11 – 2021-08-13 (×7): 2 g via INTRAVENOUS
  Filled 2021-08-11 (×7): qty 2

## 2021-08-11 MED ORDER — PANTOPRAZOLE SODIUM 40 MG IV SOLR
40.0000 mg | INTRAVENOUS | Status: DC
  Administered 2021-08-11 – 2021-08-13 (×3): 40 mg via INTRAVENOUS
  Filled 2021-08-11 (×3): qty 40

## 2021-08-11 MED ORDER — LORATADINE 10 MG PO TABS
10.0000 mg | ORAL_TABLET | Freq: Every day | ORAL | Status: DC
  Administered 2021-08-11 – 2021-08-13 (×3): 10 mg via ORAL
  Filled 2021-08-11 (×3): qty 1

## 2021-08-11 MED ORDER — ALBUTEROL SULFATE (2.5 MG/3ML) 0.083% IN NEBU: 2.5000 mg | INHALATION_SOLUTION | Freq: Four times a day (QID) | RESPIRATORY_TRACT | Status: DC | PRN

## 2021-08-11 MED ORDER — INSULIN GLARGINE-YFGN 100 UNIT/ML ~~LOC~~ SOLN
25.0000 [IU] | Freq: Every day | SUBCUTANEOUS | Status: DC
  Administered 2021-08-11 – 2021-08-12 (×2): 25 [IU] via SUBCUTANEOUS
  Filled 2021-08-11 (×4): qty 0.25

## 2021-08-11 MED ORDER — INSULIN ASPART 100 UNIT/ML IJ SOLN: 0.0000 [IU] | Freq: Every day | INTRAMUSCULAR | Status: DC

## 2021-08-11 MED ORDER — KETOROLAC TROMETHAMINE 15 MG/ML IJ SOLN: 15.0000 mg | Freq: Four times a day (QID) | INTRAMUSCULAR | Status: DC | PRN

## 2021-08-11 MED ORDER — INSULIN ASPART 100 UNIT/ML IJ SOLN
0.0000 [IU] | INTRAMUSCULAR | Status: DC
  Administered 2021-08-11: 11 [IU] via SUBCUTANEOUS
  Administered 2021-08-11: 15 [IU] via SUBCUTANEOUS
  Administered 2021-08-12: 4 [IU] via SUBCUTANEOUS

## 2021-08-11 MED ORDER — ONDANSETRON HCL 4 MG/2ML IJ SOLN
4.0000 mg | Freq: Once | INTRAMUSCULAR | Status: AC
  Administered 2021-08-11: 4 mg via INTRAVENOUS
  Filled 2021-08-11: qty 2

## 2021-08-11 MED ORDER — ACETAMINOPHEN 325 MG PO TABS
650.0000 mg | ORAL_TABLET | Freq: Four times a day (QID) | ORAL | Status: DC | PRN
  Administered 2021-08-11 – 2021-08-13 (×5): 650 mg via ORAL
  Filled 2021-08-11 (×5): qty 2

## 2021-08-11 MED ORDER — MOMETASONE FURO-FORMOTEROL FUM 200-5 MCG/ACT IN AERO
2.0000 | INHALATION_SPRAY | Freq: Two times a day (BID) | RESPIRATORY_TRACT | Status: DC
  Administered 2021-08-11 – 2021-08-13 (×5): 2 via RESPIRATORY_TRACT
  Filled 2021-08-11: qty 8.8

## 2021-08-11 MED ORDER — INSULIN ASPART 100 UNIT/ML IJ SOLN
0.0000 [IU] | Freq: Three times a day (TID) | INTRAMUSCULAR | Status: DC
Start: 2021-08-11 — End: 2021-08-11
  Administered 2021-08-11: 15 [IU] via SUBCUTANEOUS

## 2021-08-11 MED ORDER — FENTANYL CITRATE PF 50 MCG/ML IJ SOSY
50.0000 ug | PREFILLED_SYRINGE | Freq: Once | INTRAMUSCULAR | Status: AC
  Administered 2021-08-11: 50 ug via INTRAVENOUS
  Filled 2021-08-11: qty 1

## 2021-08-11 MED ORDER — ALBUTEROL SULFATE HFA 108 (90 BASE) MCG/ACT IN AERS: 1.0000 | INHALATION_SPRAY | Freq: Four times a day (QID) | RESPIRATORY_TRACT | Status: DC | PRN

## 2021-08-11 MED ORDER — HEPARIN SODIUM (PORCINE) 5000 UNIT/ML IJ SOLN
5000.0000 [IU] | Freq: Three times a day (TID) | INTRAMUSCULAR | Status: DC
  Administered 2021-08-11 – 2021-08-13 (×7): 5000 [IU] via SUBCUTANEOUS
  Filled 2021-08-11 (×7): qty 1

## 2021-08-11 MED ORDER — MORPHINE SULFATE (PF) 2 MG/ML IV SOLN
2.0000 mg | INTRAVENOUS | Status: DC | PRN
  Administered 2021-08-11 (×2): 2 mg via INTRAVENOUS
  Filled 2021-08-11 (×2): qty 1

## 2021-08-11 NOTE — Progress Notes (Signed)
Timiyah Talbert Nan  is a 50 y.o. female, with history of asthma and diabetes mellitus who presents to the ED with a chief complaint of stomach pain and nausea and vomiting.  She has been admitted with UTI/left-sided pyelonephritis and is also noted to have a renal mass to her left side suspicious for renal cell carcinoma.  She has been started on Rocephin empirically with urine culture still pending.  She continues to have poor appetite as well as some symptoms of nausea and vomiting, but abdominal pain is improving.  She is noted to be hyperglycemic and has history of type 2 diabetes.  Left-sided pyelonephritis/UTI -Continue Rocephin -Obtain blood cultures which will likely be nondiagnostic at this point -Urine cultures pending  Left renal mass -Suspicious for renal cell carcinoma -We will arrange for MRI outpatient as well as urology follow-up  Type 2 diabetes with hyperglycemia -Increase SSI to every 4 hours -Carb modified diet -Continue long-acting insulin  Hyponatremia -Normal saline -Continue to follow  Asthma -No acute bronchospasms currently, continue albuterol and Symbicort  Total care time: 35 minutes.

## 2021-08-11 NOTE — Progress Notes (Signed)
   08/11/21 1303 08/11/21 1411  Assess: MEWS Score  Temp (!) 102.8 F (39.3 C) (!) 101.7 F (38.7 C)  BP (!) 162/66 111/65  Pulse Rate (!) 118 (!) 109  Resp 20 20  SpO2  --  94 %  O2 Device  --  Room Air  Assess: MEWS Score  MEWS Temp 2 2  MEWS Systolic 0 0  MEWS Pulse 2 1  MEWS RR 0 0  MEWS LOC 0 0  MEWS Score 4 3  MEWS Score Color Red Yellow  Assess: if the MEWS score is Yellow or Red  Were vital signs taken at a resting state?  --  Yes  Focused Assessment  --  Change from prior assessment (see assessment flowsheet)  Early Detection of Sepsis Score *See Row Information*  --  High  MEWS guidelines implemented *See Row Information*  --  Yes  Take Vital Signs  Increase Vital Sign Frequency   --  Yellow: Q 2hr X 2 then Q 4hr X 2, if remains yellow, continue Q 4hrs  Escalate  MEWS: Escalate  --  Yellow: discuss with charge nurse/RN and consider discussing with provider and RRT  Notify: Charge Nurse/RN  Name of Charge Nurse/RN Notified  --  Psychologist, prison and probation services  Date Charge Nurse/RN Notified  --  08/11/21  Time Charge Nurse/RN Notified  --  1415  Notify: Provider  Provider Name/Title  --  Maurilio Lovely MD  Date Provider Notified  --  08/11/21  Time Provider Notified  --  [phone]

## 2021-08-11 NOTE — Progress Notes (Signed)
Patient complained of throat burning when swallowing. Noted patient had tremors complained of being cold. Patient grabbing at abdomen complaining of pain. Vital signs: T-102.8, P-118, R-20, BP-162/66, O2-97% Room Air. Patient given PO Tylenol for fever. MD Sherryll Burger made aware.

## 2021-08-11 NOTE — Progress Notes (Signed)
Called by patient's primary nurse to room about tremors, teeth chattering, reported no pain. Utilized interpretor at that time and notified Dr.Shah of patient's symptoms, vitals.

## 2021-08-11 NOTE — ED Provider Notes (Signed)
  Provider Note MRN:  [id number]  Arrival date & time: 08/11/21    ED Course and Medical Decision Making  Assumed care from Dr. Particia Nearing at shift change.  Abdominal pain, nausea vomiting awaiting CT imaging and urinalysis.   CT revealing pyelonephritis also with possible renal cell carcinoma.  Patient continues to feel very unwell, febrile, will admit to medicine.  Procedures  Final Clinical Impressions(s) / ED Diagnoses     ICD-10-CM   1. Lower abdominal pain  R10.30     2. Nausea and vomiting, unspecified vomiting type  R11.2     3. Dehydration  E86.0     4. Hyperglycemia  R73.9     5. Pyelonephritis  N12       ED Discharge Orders     None       Discharge Instructions   None     Elmer Sow. Pilar Plate, MD [facility] Emergency Medicine [facility] [email or web address]    Sabas Sous, MD 08/11/21 [phone]

## 2021-08-11 NOTE — Progress Notes (Signed)
Pharmacy Antibiotic Note  Xandrea Clarey is a 50 y.o. female admitted on 08/10/2021 with UTI.  Pharmacy has been consulted for Cefepime dosing.  Plan: Cefepime 2000 mg IV every 8 hours. Monitor labs, c/s, and patient improvement.     Temp (24hrs), Avg:99.8 F (37.7 C), Min:97.7 F (36.5 C), Max:102.8 F (39.3 C)  Recent Labs  Lab 08/10/21 2127 08/11/21 0604  WBC 17.8*  --   CREATININE 0.73  --   LATICACIDVEN  --  1.4    CrCl cannot be calculated (Unknown ideal weight.).    No Known Allergies  Antimicrobials this admission: Cefepime 11/24 >> CTX 11/24   Microbiology results: 11/24 BCx: pending 11/24 UCx: pending  Thank you for allowing pharmacy to be a part of this patient's care.  Judeth Cornfield, PharmD Clinical Pharmacist 08/11/2021 1:19 PM

## 2021-08-11 NOTE — Progress Notes (Signed)
Admission questions done with aid of an interpreter via ipad.  Patient states she lives with husband, and only began with pain and nausea yesterday.  However, patient stated that she felt that she has lost weight due to poor appetite.  She could not state how many pounds she has lost, but noticed she is in different sized clothing.  Patient is independent in ADLS, and has not primary care physician at this time. Explained to patient that she was being admitted for pyelonephritis, and that medicine would be given to help control pain and nausea.

## 2021-08-11 NOTE — H&P (Signed)
TRH H&P    Patient Demographics:    Margaret Santana, is a 50 y.o. female  MRN: [id number]  DOB - Apr 21, 1971  Admit Date - 08/10/2021  Referring MD/NP/PA: Sedonia Small  Outpatient Primary MD for the patient is Pcp, No  Patient coming from: home  Chief complaint- Stomach ache   HPI:    Margaret Santana  is a 50 y.o. female, with history of asthma and diabetes mellitus who presents to the ED with a chief complaint of stomach pain and nausea and vomiting.  Patient reports that started the day prior to presentation Hayden Pedro, and lasted all day.  She reports at least 10 episodes of nonbloody emesis.  She has a stomachache that is more on her left lower quadrant than her right lower quadrant but is otherwise diffuse.  She describes the pain as sharp.  It is constant.  Nothing makes it better and nothing makes it worse.  Her last normal meal was the day prior to the stomach pain starting.  That meal was in the evening.  Her last normal bowel movement was 3 days ago.  It is not normal for her to be constipated.  Patient reports she has not tried any medications to alleviate the symptoms at home.  She denies any dysuria, hematuria, urinary urgency, urinary frequency.  She does report suprapubic pain and left flank pain.  Patient has no other complaints at this time.  Patient is Spanish-speaking and the interview was done through the use of an interpreter on the iPad.  Patient does not smoke, does not drink alcohol, does not use illicit drugs.  Patient is not vaccinated for COVID.  Patient is full code.  In the ED Temp 101 heart rate 110 white blood cell count 17.8 Chemistry panel is unremarkable aside from hyperglycemia 298 Albumin 3.3 UA is indicative of UTI Negative respiratory panel CT abdomen pelvis shows left-sided pyelonephritis no drainable fluid collection or abscess.  3.0 x 2.6 cm enhancing left kidney  lesion that could be indicative of renal cell carcinoma Patient did have transient hypotension in the ED, but responded to fluids Urology was consulted and recommends follow-up for suspicious lesion    Review of systems:    In addition to the HPI above,  Admits to fever No Headache, No changes with Vision or hearing, No problems swallowing food or Liquids, No Chest pain, Cough or Shortness of Breath, Admits to abdominal pain, nausea, vomiting, constipation No Blood in stool or Urine, No dysuria, No new skin rashes or bruises, No new joints pains-aches,  No new weakness, tingling, numbness in any extremity, No recent weight gain or loss, No polyuria, polydypsia or polyphagia, No significant Mental Stressors.  All other systems reviewed and are negative.    Past History of the following :    Past Medical History:  Diagnosis Date   Asthma    Diabetes mellitus without complication ([place])       Past Surgical History:  Procedure Laterality Date   CESAREAN SECTION        Social  History:      Social History   Tobacco Use   Smoking status: Never   Smokeless tobacco: Never  Substance Use Topics   Alcohol use: Never       Family History :     Family History  Problem Relation Age of Onset   Asthma Mother       Home Medications:   Prior to Admission medications   Medication Sig Start Date End Date Taking? Authorizing Provider  acetaminophen (TYLENOL) 325 MG tablet Take 2 tablets (650 mg total) by mouth every 6 (six) hours as needed for mild pain (or Fever >/= 101). 01/31/19   Calvert Cantor, MD  albuterol (VENTOLIN HFA) 108 (90 Base) MCG/ACT inhaler Inhale 1-2 puffs into the lungs every 6 (six) hours as needed for wheezing or shortness of breath. 01/31/19   Calvert Cantor, MD  budesonide-formoterol (SYMBICORT) 160-4.5 MCG/ACT inhaler Inhale 2 puffs into the lungs 2 (two) times daily. 01/31/19   Calvert Cantor, MD  chlorpheniramine-HYDROcodone (TUSSIONEX) 10-8 MG/5ML  SUER Take 5 mLs by mouth every 12 (twelve) hours. 01/31/19   Calvert Cantor, MD  dextromethorphan-guaiFENesin (MUCINEX DM) 30-600 MG 12hr tablet Take 1 tablet by mouth 2 (two) times daily. 01/31/19   Calvert Cantor, MD  insulin aspart protamine- aspart (NOVOLOG MIX 70/30) (70-30) 100 UNIT/ML injection Inject 0.55 mLs (55 Units total) into the skin 2 (two) times daily with a meal. 01/31/19   Calvert Cantor, MD  loratadine (CLARITIN) 10 MG tablet Take 10 mg by mouth daily.    [provider]  metFORMIN (GLUCOPHAGE) 500 MG tablet Take 1 tablet (500 mg total) by mouth 2 (two) times daily with a meal. 05/21/18   Elgergawy, Leana Roe, MD  montelukast (SINGULAIR) 10 MG tablet Take 1 tablet (10 mg total) by mouth daily. 01/31/19 01/31/20  Calvert Cantor, MD  naproxen (NAPROSYN) 500 MG tablet Take 1 tablet (500 mg total) by mouth 2 (two) times daily as needed. 02/27/20   Rancour, Jeannett Senior, MD  omeprazole (PRILOSEC) 20 MG capsule Take 1 capsule (20 mg total) by mouth daily. 02/27/20   Rancour, Jeannett Senior, MD  predniSONE (DELTASONE) 10 MG tablet Take 6 tablets (60 mg total) by mouth daily with breakfast. Take 6 tabs tomorrow and then decrease by 1 tab daily until finished 01/31/19   Rizwan, Ladell Heads, MD  THEOPHYLLINE CR PO Take 10 mg by mouth daily.    [provider]     Allergies:    No Known Allergies   Physical Exam:   Vitals  Blood pressure (!) 187/86, pulse 86, temperature 97.7 F (36.5 C), temperature source Oral, resp. rate 20, SpO2 97 %.   1.  General: Patient lying supine in bed,  no acute distress   2. Psychiatric: Alert and oriented x 3, mood and behavior normal for situation, pleasant and cooperative with exam   3. Neurologic: Speech and language are normal, face is symmetric, moves all 4 extremities voluntarily, at baseline without acute deficits on limited exam   4. HEENMT:  Head is atraumatic, normocephalic, pupils reactive to light, neck is supple, trachea is midline, mucous  membranes are moist   5. Respiratory : Lungs are clear to auscultation bilaterally without wheezing, rhonchi, rales, no cyanosis, no increase in work of breathing or accessory muscle use   6. Cardiovascular : Heart rate normal, rhythm is regular, no murmurs, rubs or gallops, no peripheral edema, peripheral pulses palpated   7. Gastrointestinal:  Abdomen is soft, nondistended, tender to palpation over the suprapubic  region, and left flank, bowel sounds active, no masses or organomegaly palpated   8. Skin:  Skin is warm, dry and intact without rashes, acute lesions, or ulcers on limited exam   9.Musculoskeletal:  No acute deformities or trauma, no asymmetry in tone, no peripheral edema, peripheral pulses palpated, no tenderness to palpation in the extremities     Data Review:    CBC Recent Labs  Lab 08/10/21 2127  WBC 17.8*  HGB 13.6  HCT 39.6  PLT 168  MCV 90.4  MCH 31.1  MCHC 34.3  RDW 11.7  LYMPHSABS 1.2  MONOABS 1.2*  EOSABS 0.0  BASOSABS 0.1   ------------------------------------------------------------------------------------------------------------------  Results for orders placed or performed during the hospital encounter of 08/10/21 (from the past 48 hour(s))  CBC with Differential     Status: Abnormal   Collection Time: 08/10/21  9:27 PM  Result Value Ref Range   WBC 17.8 (H) 4.0 - 10.5 K/uL   RBC 4.38 3.87 - 5.11 MIL/uL   Hemoglobin 13.6 12.0 - 15.0 g/dL   HCT 39.6 36.0 - 46.0 %   MCV 90.4 80.0 - 100.0 fL   MCH 31.1 26.0 - 34.0 pg   MCHC 34.3 30.0 - 36.0 g/dL   RDW 11.7 11.5 - 15.5 %   Platelets 168 150 - 400 K/uL   nRBC 0.0 0.0 - 0.2 %   Neutrophils Relative % 85 %   Neutro Abs 15.2 (H) 1.7 - 7.7 K/uL   Lymphocytes Relative 7 %   Lymphs Abs 1.2 0.7 - 4.0 K/uL   Monocytes Relative 7 %   Monocytes Absolute 1.2 (H) 0.1 - 1.0 K/uL   Eosinophils Relative 0 %   Eosinophils Absolute 0.0 0.0 - 0.5 K/uL   Basophils Relative 0 %   Basophils Absolute 0.1  0.0 - 0.1 K/uL   Immature Granulocytes 1 %   Abs Immature Granulocytes 0.10 (H) 0.00 - 0.07 K/uL    Comment: Performed at [facility], [address], [place], [place] [address]  Comprehensive metabolic panel     Status: Abnormal   Collection Time: 08/10/21  9:27 PM  Result Value Ref Range   Sodium 127 (L) 135 - 145 mmol/L   Potassium 3.9 3.5 - 5.1 mmol/L   Chloride 96 (L) 98 - 111 mmol/L   CO2 22 22 - 32 mmol/L   Glucose, Bld 298 (H) 70 - 99 mg/dL    Comment: Glucose reference range applies only to samples taken after fasting for at least 8 hours.   BUN 12 6 - 20 mg/dL   Creatinine, Ser 0.73 0.44 - 1.00 mg/dL   Calcium 8.3 (L) 8.9 - 10.3 mg/dL   Total Protein 6.8 6.5 - 8.1 g/dL   Albumin 3.3 (L) 3.5 - 5.0 g/dL   AST 11 (L) 15 - 41 U/L   ALT 11 0 - 44 U/L   Alkaline Phosphatase 47 38 - 126 U/L   Total Bilirubin 1.0 0.3 - 1.2 mg/dL   GFR, Estimated >60 >60 mL/min    Comment: (NOTE) Calculated using the CKD-EPI Creatinine Equation (2021)    Anion gap 9 5 - 15    Comment: Performed at [facility], [address], [place], [place] [address]  Lipase, blood     Status: None   Collection Time: 08/10/21  9:27 PM  Result Value Ref Range   Lipase 29 11 - 51 U/L    Comment: Performed at [facility], [address] [place],  [address]  Resp Panel by RT-PCR (Flu A&B, Covid) Nasopharyngeal Swab     Status: None   Collection Time: 08/10/21  9:37 PM   Specimen: Nasopharyngeal Swab; Nasopharyngeal(NP) swabs in vial transport medium  Result Value Ref Range   SARS Coronavirus 2 by RT PCR NEGATIVE NEGATIVE    Comment: (NOTE) SARS-CoV-2 target nucleic acids are NOT DETECTED.  The SARS-CoV-2 RNA is generally detectable in upper respiratory specimens during the acute phase of infection. The lowest concentration of SARS-CoV-2 viral copies this assay can detect is 138 copies/mL. A negative result does not preclude SARS-Cov-2 infection and should not be used as the sole basis for treatment  or other patient management decisions. A negative result may occur with  improper specimen collection/handling, submission of specimen other than nasopharyngeal swab, presence of viral mutation(s) within the areas targeted by this assay, and inadequate number of viral copies(<138 copies/mL). A negative result must be combined with clinical observations, patient history, and epidemiological information. The expected result is Negative.  Fact Sheet for Patients:  EntrepreneurPulse.com.au  Fact Sheet for Healthcare Providers:  IncredibleEmployment.be  This test is no t yet approved or cleared by the [place] FDA and  has been authorized for detection and/or diagnosis of SARS-CoV-2 by FDA under an Emergency Use Authorization (EUA). This EUA will remain  in effect (meaning this test can be used) for the duration of the COVID-19 declaration under Section 564(b)(1) of the Act, 21 U.S.C.section 360bbb-3(b)(1), unless the authorization is terminated  or revoked sooner.       Influenza A by PCR NEGATIVE NEGATIVE   Influenza B by PCR NEGATIVE NEGATIVE    Comment: (NOTE) The Xpert Xpress SARS-CoV-2/FLU/RSV plus assay is intended as an aid in the diagnosis of influenza from Nasopharyngeal swab specimens and should not be used as a sole basis for treatment. Nasal washings and aspirates are unacceptable for Xpert Xpress SARS-CoV-2/FLU/RSV testing.  Fact Sheet for Patients: EntrepreneurPulse.com.au  Fact Sheet for Healthcare Providers: IncredibleEmployment.be  This test is not yet approved or cleared by the [place] FDA and has been authorized for detection and/or diagnosis of SARS-CoV-2 by FDA under an Emergency Use Authorization (EUA). This EUA will remain in effect (meaning this test can be used) for the duration of the COVID-19 declaration under Section 564(b)(1) of the Act, 21 U.S.C. section  360bbb-3(b)(1), unless the authorization is terminated or revoked.  Performed at [facility], [address], [place], [place] [address]   hCG, serum, qualitative     Status: None   Collection Time: 08/10/21 11:06 PM  Result Value Ref Range   Preg, Serum NEGATIVE NEGATIVE    Comment:        THE SENSITIVITY OF THIS METHODOLOGY IS >10 mIU/mL. Performed at [facility], [address] [place], [place] [address]   Urinalysis, Routine w reflex microscopic Urine, Catheterized     Status: Abnormal   Collection Time: 08/11/21  2:04 AM  Result Value Ref Range   Color, Urine YELLOW YELLOW   APPearance HAZY (A) CLEAR   Specific Gravity, Urine 1.015 1.005 - 1.030   pH 6.0 5.0 - 8.0   Glucose, UA >=500 (A) NEGATIVE mg/dL   Hgb urine dipstick SMALL (A) NEGATIVE   Bilirubin Urine NEGATIVE NEGATIVE   Ketones, ur 15 (A) NEGATIVE mg/dL   Protein, ur 100 (A) NEGATIVE mg/dL   Nitrite NEGATIVE NEGATIVE   Leukocytes,Ua NEGATIVE NEGATIVE    Comment: Performed at [facility], [address] [place], [place] [address]  Urinalysis, Microscopic (  reflex)     Status: Abnormal   Collection Time: 08/11/21  2:04 AM  Result Value Ref Range   RBC / HPF 6-10 0 - 5 RBC/hpf   WBC, UA >50 0 - 5 WBC/hpf   Bacteria, UA MANY (A) NONE SEEN   Squamous Epithelial / LPF NONE SEEN 0 - 5   Hyaline Casts, UA PRESENT     Comment: Performed at [facility], [address] [place], [place] [address]    Chemistries  Recent Labs  Lab 08/10/21 2127  NA 127*  K 3.9  CL 96*  CO2 22  GLUCOSE 298*  BUN 12  CREATININE 0.73  CALCIUM 8.3*  AST 11*  ALT 11  ALKPHOS 47  BILITOT 1.0   ------------------------------------------------------------------------------------------------------------------  ------------------------------------------------------------------------------------------------------------------ GFR: CrCl cannot be calculated (Unknown ideal weight.). Liver Function Tests: Recent Labs  Lab  08/10/21 2127  AST 11*  ALT 11  ALKPHOS 47  BILITOT 1.0  PROT 6.8  ALBUMIN 3.3*   Recent Labs  Lab 08/10/21 2127  LIPASE 29   No results for input(s): AMMONIA in the last 168 hours. Coagulation Profile: No results for input(s): INR, PROTIME in the last 168 hours. Cardiac Enzymes: No results for input(s): CKTOTAL, CKMB, CKMBINDEX, TROPONINI in the last 168 hours. BNP (last 3 results) No results for input(s): PROBNP in the last 8760 hours. HbA1C: No results for input(s): HGBA1C in the last 72 hours. CBG: No results for input(s): GLUCAP in the last 168 hours. Lipid Profile: No results for input(s): CHOL, HDL, LDLCALC, TRIG, CHOLHDL, LDLDIRECT in the last 72 hours. Thyroid Function Tests: No results for input(s): TSH, T4TOTAL, FREET4, T3FREE, THYROIDAB in the last 72 hours. Anemia Panel: No results for input(s): VITAMINB12, FOLATE, FERRITIN, TIBC, IRON, RETICCTPCT in the last 72 hours.  --------------------------------------------------------------------------------------------------------------- Urine analysis:    Component Value Date/Time   COLORURINE YELLOW 08/11/2021 0204   APPEARANCEUR HAZY (A) 08/11/2021 0204   LABSPEC 1.015 08/11/2021 0204   PHURINE 6.0 08/11/2021 0204   GLUCOSEU >=500 (A) 08/11/2021 0204   HGBUR SMALL (A) 08/11/2021 0204   BILIRUBINUR NEGATIVE 08/11/2021 0204   KETONESUR 15 (A) 08/11/2021 0204   PROTEINUR 100 (A) 08/11/2021 0204   NITRITE NEGATIVE 08/11/2021 0204   LEUKOCYTESUR NEGATIVE 08/11/2021 0204      Imaging Results:    CT ABDOMEN PELVIS W CONTRAST  Result Date: 08/11/2021 CLINICAL DATA:  Abdominal pain. EXAM: CT ABDOMEN AND PELVIS WITH CONTRAST TECHNIQUE: Multidetector CT imaging of the abdomen and pelvis was performed using the standard protocol following bolus administration of intravenous contrast. CONTRAST:  143mL OMNIPAQUE IOHEXOL 300 MG/ML  SOLN COMPARISON:  CT abdomen pelvis dated 02/27/2020. FINDINGS: Lower chest: The  visualized lung bases are clear. No intra-abdominal free air or free fluid. Hepatobiliary: Probable mild fatty liver. No intrahepatic biliary dilatation. Probable small stone within the gallbladder. No pericholecystic fluid or evidence of acute cholecystitis by CT. Pancreas: Unremarkable. No pancreatic ductal dilatation or surrounding inflammatory changes. Spleen: Normal in size without focal abnormality. Adrenals/Urinary Tract: The adrenal glands are unremarkable. There is a 3.0 x 2.6 cm enhancing lesion in the interpolar left kidney as seen on the prior CT. Findings concerning for renal cell carcinoma. Further characterization with renal mass protocol MRI on a nonemergent/outpatient basis recommended. Areas of heterogeneous enhancement in the upper pole and lower pole of the left kidney most concerning for pyelonephritis. No drainable fluid collection or abscess. Subcentimeter bilateral renal hypodense lesions are not characterized. The visualized ureters and urinary bladder appear unremarkable.  Air within the urinary bladder may be related to recent instrumentation or secondary to infectious etiology. Stomach/Bowel: There is moderate stool throughout the colon. No bowel obstruction or active inflammation. The appendix is normal. Vascular/Lymphatic: The abdominal aorta and IVC are unremarkable. No portal venous gas. There is no adenopathy. Reproductive: The uterus is anteverted.  No adnexal masses. Other: None Musculoskeletal: Degenerative changes of the lower lumbar spine. L5-S1 facet arthropathy. No acute osseous pathology. IMPRESSION: 1. Left-sided pyelonephritis. No drainable fluid collection or abscess. 2. A 3.0 x 2.6 cm enhancing lesion in the interpolar left kidney concerning for renal cell carcinoma. Further characterization with renal mass protocol MRI on a nonemergent/outpatient basis recommended. 3. No bowel obstruction. Normal appendix. Electronically Signed   By: Anner Crete M.D.   On: 08/11/2021  00:50       Assessment & Plan:    Principal Problem:   Pyelonephritis Active Problems:   Asthma   Type 2 diabetes mellitus (HCC)   SIRS (systemic inflammatory response syndrome) (HCC)   Pyelonephritis SIRS positive with a fever, tachycardia, leukocytosis No endorgan damage identified yet Lactic acid in the a.m. Continue Rocephin Urine culture pending Continue to monitor Abnormal CT Possibly malignant finding Consult oncology Diabetes mellitus type 2 55 units 70/30 at home Continue 25 units long-acting with sliding scale coverage Carb modified diet Asthma Continue albuterol and Symbicort Protein calorie malnutrition Mild Albumin 3.3 Encourage nutrient dense food choices   DVT Prophylaxis-   Heparin - SCDs   AM Labs Ordered, also please review Full Orders  Family Communication: No family at bedside Code Status: Full  Admission status: Observation Disposition: Anticipated Discharge 24 - 48 hours Discharge to home  Time spent in minutes : [place] DO

## 2021-08-11 NOTE — ED Notes (Signed)
Dr. Ghosh-Zierle in with pt at this time 

## 2021-08-11 NOTE — Progress Notes (Signed)
   08/11/21 1303  Assess: MEWS Score  Temp (!) 102.8 F (39.3 C)  BP (!) 162/66  Pulse Rate (!) 118  Resp 20  SpO2 97 %  Assess: MEWS Score  MEWS Temp 2  MEWS Systolic 0  MEWS Pulse 2  MEWS RR 0  MEWS LOC 0  MEWS Score 4  MEWS Score Color Red  Assess: if the MEWS score is Yellow or Red  Were vital signs taken at a resting state? Yes  Focused Assessment Change from prior assessment (see assessment flowsheet)  Early Detection of Sepsis Score *See Row Information* Medium  MEWS guidelines implemented *See Row Information* Yes  Treat  Pain Scale 0-10  Pain Score 10  Pain Type Acute pain  Pain Location Abdomen  Pain Intervention(s) Medication (See eMAR)  Take Vital Signs  Increase Vital Sign Frequency  Red: Q 1hr X 4 then Q 4hr X 4, if remains red, continue Q 4hrs  Escalate  MEWS: Escalate Red: discuss with charge nurse/RN and provider, consider discussing with RRT  Notify: Charge Nurse/RN  Name of Charge Nurse/RN Notified Psychologist, prison and probation services  Date Charge Nurse/RN Notified 08/11/21  Time Charge Nurse/RN Notified 1310  Notify: Provider  Provider Name/Title Maurilio Lovely MD  Date Provider Notified 08/11/21  Time Provider Notified 1310  Notification Type  (secure chat)  Notification Reason Change in status (Red MEWS)  Provider response Other (Comment) (secure chat)  Date of Provider Response 08/11/21  Time of Provider Response 1314

## 2021-08-11 NOTE — ED Notes (Signed)
Gone to CT

## 2021-08-12 LAB — CBC WITH DIFFERENTIAL/PLATELET
Abs Immature Granulocytes: 0.05 10*3/uL (ref 0.00–0.07)
Basophils Absolute: 0 10*3/uL (ref 0.0–0.1)
Basophils Relative: 0 %
Eosinophils Absolute: 0 10*3/uL (ref 0.0–0.5)
Eosinophils Relative: 0 %
HCT: 38.1 % (ref 36.0–46.0)
Hemoglobin: 12.8 g/dL (ref 12.0–15.0)
Immature Granulocytes: 1 %
Lymphocytes Relative: 12 %
Lymphs Abs: 1.3 10*3/uL (ref 0.7–4.0)
MCH: 30.3 pg (ref 26.0–34.0)
MCHC: 33.6 g/dL (ref 30.0–36.0)
MCV: 90.1 fL (ref 80.0–100.0)
Monocytes Absolute: 1 10*3/uL (ref 0.1–1.0)
Monocytes Relative: 10 %
Neutro Abs: 8.1 10*3/uL — ABNORMAL HIGH (ref 1.7–7.7)
Neutrophils Relative %: 77 %
Platelets: 147 10*3/uL — ABNORMAL LOW (ref 150–400)
RBC: 4.23 MIL/uL (ref 3.87–5.11)
RDW: 11.9 % (ref 11.5–15.5)
WBC: 10.5 10*3/uL (ref 4.0–10.5)
nRBC: 0 % (ref 0.0–0.2)

## 2021-08-12 LAB — COMPREHENSIVE METABOLIC PANEL
ALT: 12 U/L (ref 0–44)
AST: 12 U/L — ABNORMAL LOW (ref 15–41)
Albumin: 2.6 g/dL — ABNORMAL LOW (ref 3.5–5.0)
Alkaline Phosphatase: 47 U/L (ref 38–126)
Anion gap: 9 (ref 5–15)
BUN: 17 mg/dL (ref 6–20)
CO2: 23 mmol/L (ref 22–32)
Calcium: 8.2 mg/dL — ABNORMAL LOW (ref 8.9–10.3)
Chloride: 102 mmol/L (ref 98–111)
Creatinine, Ser: 0.68 mg/dL (ref 0.44–1.00)
GFR, Estimated: >60 mL/min (ref >60)
Glucose, Bld: 95 mg/dL (ref 70–99)
Potassium: 3.8 mmol/L (ref 3.5–5.1)
Sodium: 134 mmol/L — ABNORMAL LOW (ref 135–145)
Total Bilirubin: 0.6 mg/dL (ref 0.3–1.2)
Total Protein: 6.2 g/dL — ABNORMAL LOW (ref 6.5–8.1)

## 2021-08-12 LAB — GLUCOSE, CAPILLARY
Glucose-Capillary: 101 mg/dL — ABNORMAL HIGH (ref 70–99)
Glucose-Capillary: 110 mg/dL — ABNORMAL HIGH (ref 70–99)
Glucose-Capillary: 155 mg/dL — ABNORMAL HIGH (ref 70–99)
Glucose-Capillary: 166 mg/dL — ABNORMAL HIGH (ref 70–99)
Glucose-Capillary: 187 mg/dL — ABNORMAL HIGH (ref 70–99)
Glucose-Capillary: 271 mg/dL — ABNORMAL HIGH (ref 70–99)

## 2021-08-12 LAB — MAGNESIUM
Magnesium: 1.8 mg/dL (ref 1.7–2.4)
Magnesium: 1.8 mg/dL (ref 1.7–2.4)

## 2021-08-12 MED ORDER — ENSURE ENLIVE PO LIQD
237.0000 mL | Freq: Two times a day (BID) | ORAL | Status: DC
  Administered 2021-08-12 – 2021-08-13 (×3): 237 mL via ORAL

## 2021-08-12 MED ORDER — INSULIN ASPART 100 UNIT/ML IJ SOLN: 0.0000 [IU] | Freq: Every day | INTRAMUSCULAR | Status: DC

## 2021-08-12 MED ORDER — INSULIN ASPART 100 UNIT/ML IJ SOLN
0.0000 [IU] | Freq: Three times a day (TID) | INTRAMUSCULAR | Status: DC
Start: 2021-08-12 — End: 2021-08-13
  Administered 2021-08-12: 8 [IU] via SUBCUTANEOUS
  Administered 2021-08-12: 3 [IU] via SUBCUTANEOUS
  Administered 2021-08-13: 5 [IU] via SUBCUTANEOUS
  Administered 2021-08-13: 2 [IU] via SUBCUTANEOUS

## 2021-08-12 MED ORDER — INSULIN ASPART 100 UNIT/ML IJ SOLN
3.0000 [IU] | Freq: Three times a day (TID) | INTRAMUSCULAR | Status: DC
  Administered 2021-08-12 – 2021-08-13 (×4): 3 [IU] via SUBCUTANEOUS

## 2021-08-12 NOTE — Progress Notes (Signed)
PROGRESS NOTE    Margaret Santana  [id number]:[id number] DOB: December 02, 1970 DOA: 08/10/2021 PCP: Pcp, No   Brief Narrative:   Margaret Santana  is a 50 y.o. female, with history of asthma and diabetes mellitus who presents to the ED with a chief complaint of stomach pain and nausea and vomiting.  She has been admitted with UTI/left-sided pyelonephritis and is also noted to have a renal mass to her left side suspicious for renal cell carcinoma.  She has been started on Rocephin empirically with urine culture still pending.  She continues to have poor appetite as well as some symptoms of nausea and vomiting, but abdominal pain is improving.  She is noted to be hyperglycemic and has history of type 2 diabetes.  Assessment & Plan:   Principal Problem:   Pyelonephritis Active Problems:   Asthma   Type 2 diabetes mellitus (HCC)   SIRS (systemic inflammatory response syndrome) (HCC)   Acute pyelonephritis   Left-sided pyelonephritis/UTI -Continue on Cefepime -Obtain blood cultures which will likely be nondiagnostic at this point -Urine cultures pending   Left renal mass -Suspicious for renal cell carcinoma -We will arrange for MRI outpatient as well as urology follow-up   Type 2 diabetes with hyperglycemia-improving -Continue SSI as ordered -Carb modified diet -Continue long-acting insulin   Hyponatremia-improved -DC IV fluid -Continue to follow   Asthma -No acute bronchospasms currently, continue albuterol and Symbicort   DVT prophylaxis:Heparin Code Status: Full Family Communication: Discussed with partner at bedside 11/24 Disposition Plan:  Status is: Inpatient  Remains inpatient appropriate because: IV antibiotics   Consultants:  None  Procedures:  See below  Antimicrobials:  Anti-infectives (From admission, onward)    Start     Dose/Rate Route Frequency Ordered Stop   08/12/21 0600  cefTRIAXone (ROCEPHIN) 2 g in sodium chloride 0.9 % 100 mL IVPB   Status:  Discontinued        2 g 200 mL/hr over 30 Minutes Intravenous Every 24 hours 08/11/21 0946 08/11/21 1314   08/12/21 0300  cefTRIAXone (ROCEPHIN) 1 g in sodium chloride 0.9 % 100 mL IVPB  Status:  Discontinued        1 g 200 mL/hr over 30 Minutes Intravenous Every 24 hours 08/11/21 0438 08/11/21 0946   08/11/21 1400  ceFEPIme (MAXIPIME) 2 g in sodium chloride 0.9 % 100 mL IVPB        2 g 200 mL/hr over 30 Minutes Intravenous Every 8 hours 08/11/21 1316     08/11/21 0245  cefTRIAXone (ROCEPHIN) 2 g in sodium chloride 0.9 % 100 mL IVPB        2 g 200 mL/hr over 30 Minutes Intravenous  Once 08/11/21 0235 08/11/21 0423       Subjective: Patient seen and evaluated today with no new acute complaints or concerns. No acute concerns or events noted overnight.  She states her abdominal pain is improved and she is no longer febrile.  Tolerating diet better.  Objective: Vitals:   08/11/21 2300 08/12/21 0449 08/12/21 0756 08/12/21 0826  BP:  126/74    Pulse:  90    Resp:  16    Temp: 99.5 F (37.5 C) (!) 100.4 F (38 C) 98.4 F (36.9 C)   TempSrc: Oral Oral    SpO2:  96%  96%    Intake/Output Summary (Last 24 hours) at 08/12/2021 0950 Last data filed at 08/12/2021 0246 Gross per 24 hour  Intake 1788.25 ml  Output --  Net 1788.25 ml  There were no vitals filed for this visit.  Examination:  General exam: Appears calm and comfortable  Respiratory system: Clear to auscultation. Respiratory effort normal. Cardiovascular system: S1 & S2 heard, RRR.  Gastrointestinal system: Abdomen is soft Central nervous system: Alert and awake Extremities: No edema Skin: No significant lesions noted Psychiatry: Flat affect.    Data Reviewed: I have personally reviewed following labs and imaging studies  CBC: Recent Labs  Lab 08/10/21 2127 08/12/21 0437  WBC 17.8* 10.5  NEUTROABS 15.2* 8.1*  HGB 13.6 12.8  HCT 39.6 38.1  MCV 90.4 90.1  PLT 168 147*   Basic Metabolic  Panel: Recent Labs  Lab 08/10/21 2127 08/12/21 0437  NA 127* 134*  K 3.9 3.8  CL 96* 102  CO2 22 23  GLUCOSE 298* 95  BUN 12 17  CREATININE 0.73 0.68  CALCIUM 8.3* 8.2*  MG  --  1.8   GFR: CrCl cannot be calculated (Unknown ideal weight.). Liver Function Tests: Recent Labs  Lab 08/10/21 2127 08/12/21 0437  AST 11* 12*  ALT 11 12  ALKPHOS 47 47  BILITOT 1.0 0.6  PROT 6.8 6.2*  ALBUMIN 3.3* 2.6*   Recent Labs  Lab 08/10/21 2127  LIPASE 29   No results for input(s): AMMONIA in the last 168 hours. Coagulation Profile: No results for input(s): INR, PROTIME in the last 168 hours. Cardiac Enzymes: No results for input(s): CKTOTAL, CKMB, CKMBINDEX, TROPONINI in the last 168 hours. BNP (last 3 results) No results for input(s): PROBNP in the last 8760 hours. HbA1C: Recent Labs    08/11/21 0604  HGBA1C 11.8*   CBG: Recent Labs  Lab 08/11/21 2022 08/11/21 2233 08/12/21 0014 08/12/21 0403 08/12/21 0711  GLUCAP 310* 308* 187* 101* 110*   Lipid Profile: No results for input(s): CHOL, HDL, LDLCALC, TRIG, CHOLHDL, LDLDIRECT in the last 72 hours. Thyroid Function Tests: No results for input(s): TSH, T4TOTAL, FREET4, T3FREE, THYROIDAB in the last 72 hours. Anemia Panel: No results for input(s): VITAMINB12, FOLATE, FERRITIN, TIBC, IRON, RETICCTPCT in the last 72 hours. Sepsis Labs: Recent Labs  Lab 08/11/21 0604  LATICACIDVEN 1.4    Recent Results (from the past 240 hour(s))  Resp Panel by RT-PCR (Flu A&B, Covid) Nasopharyngeal Swab     Status: None   Collection Time: 08/10/21  9:37 PM   Specimen: Nasopharyngeal Swab; Nasopharyngeal(NP) swabs in vial transport medium  Result Value Ref Range Status   SARS Coronavirus 2 by RT PCR NEGATIVE NEGATIVE Final    Comment: (NOTE) SARS-CoV-2 target nucleic acids are NOT DETECTED.  The SARS-CoV-2 RNA is generally detectable in upper respiratory specimens during the acute phase of infection. The lowest concentration of  SARS-CoV-2 viral copies this assay can detect is 138 copies/mL. A negative result does not preclude SARS-Cov-2 infection and should not be used as the sole basis for treatment or other patient management decisions. A negative result may occur with  improper specimen collection/handling, submission of specimen other than nasopharyngeal swab, presence of viral mutation(s) within the areas targeted by this assay, and inadequate number of viral copies(<138 copies/mL). A negative result must be combined with clinical observations, patient history, and epidemiological information. The expected result is Negative.  Fact Sheet for Patients:  BloggerCourse.com  Fact Sheet for Healthcare Providers:  SeriousBroker.it  This test is no t yet approved or cleared by the [place] FDA and  has been authorized for detection and/or diagnosis of SARS-CoV-2 by FDA under an Emergency Use Authorization (EUA). This EUA  will remain  in effect (meaning this test can be used) for the duration of the COVID-19 declaration under Section 564(b)(1) of the Act, 21 U.S.C.section 360bbb-3(b)(1), unless the authorization is terminated  or revoked sooner.       Influenza A by PCR NEGATIVE NEGATIVE Final   Influenza B by PCR NEGATIVE NEGATIVE Final    Comment: (NOTE) The Xpert Xpress SARS-CoV-2/FLU/RSV plus assay is intended as an aid in the diagnosis of influenza from Nasopharyngeal swab specimens and should not be used as a sole basis for treatment. Nasal washings and aspirates are unacceptable for Xpert Xpress SARS-CoV-2/FLU/RSV testing.  Fact Sheet for Patients: BloggerCourse.com  Fact Sheet for Healthcare Providers: SeriousBroker.it  This test is not yet approved or cleared by the [place] FDA and has been authorized for detection and/or diagnosis of SARS-CoV-2 by FDA under an Emergency Use  Authorization (EUA). This EUA will remain in effect (meaning this test can be used) for the duration of the COVID-19 declaration under Section 564(b)(1) of the Act, 21 U.S.C. section 360bbb-3(b)(1), unless the authorization is terminated or revoked.  Performed at [facility], [address] [place], [place] [address]   Culture, blood (routine x 2)     Status: None (Preliminary result)   Collection Time: 08/11/21 10:07 AM   Specimen: BLOOD RIGHT HAND  Result Value Ref Range Status   Specimen Description BLOOD RIGHT HAND  Final   Special Requests   Final    BOTTLES DRAWN AEROBIC AND ANAEROBIC Blood Culture results may not be optimal due to an inadequate volume of blood received in culture bottles   Culture   Final    NO GROWTH < 24 HOURS Performed at [facility], [address], [place], [place] [address]    Report Status PENDING  Incomplete  Culture, blood (routine x 2)     Status: None (Preliminary result)   Collection Time: 08/11/21 10:07 AM   Specimen: Left Antecubital; Blood  Result Value Ref Range Status   Specimen Description LEFT ANTECUBITAL  Final   Special Requests   Final    BOTTLES DRAWN AEROBIC AND ANAEROBIC Blood Culture adequate volume   Culture   Final    NO GROWTH < 24 HOURS Performed at [facility], [address], [place], [place] [address]    Report Status PENDING  Incomplete         Radiology Studies: DG Abd 1 View  Result Date: 08/11/2021 CLINICAL DATA:  Abdominal pain EXAM: ABDOMEN - 1 VIEW COMPARISON:  08/11/2021 FINDINGS: The bowel gas pattern is normal. No radio-opaque calculi or other significant radiographic abnormality are seen. IMPRESSION: Negative. Electronically Signed   By: Duanne Guess D.O.   On: 08/11/2021 13:59   CT ABDOMEN PELVIS W CONTRAST  Result Date: 08/11/2021 CLINICAL DATA:  Abdominal pain. EXAM: CT ABDOMEN AND PELVIS WITH CONTRAST TECHNIQUE: Multidetector CT imaging of the abdomen and pelvis was performed using the standard  protocol following bolus administration of intravenous contrast. CONTRAST:  OMNIPAQUE IOHEXOL 300 MG/ML  SOLN COMPARISON:  CT abdomen pelvis dated 02/27/2020. FINDINGS: Lower chest: The visualized lung bases are clear. No intra-abdominal free air or free fluid. Hepatobiliary: Probable mild fatty liver. No intrahepatic biliary dilatation. Probable small stone within the gallbladder. No pericholecystic fluid or evidence of acute cholecystitis by CT. Pancreas: Unremarkable. No pancreatic ductal dilatation or surrounding inflammatory changes. Spleen: Normal in size without focal abnormality. Adrenals/Urinary Tract: The adrenal glands are unremarkable. There is a 3.0 x 2.6 cm enhancing lesion  in the interpolar left kidney as seen on the prior CT. Findings concerning for renal cell carcinoma. Further characterization with renal mass protocol MRI on a nonemergent/outpatient basis recommended. Areas of heterogeneous enhancement in the upper pole and lower pole of the left kidney most concerning for pyelonephritis. No drainable fluid collection or abscess. Subcentimeter bilateral renal hypodense lesions are not characterized. The visualized ureters and urinary bladder appear unremarkable. Air within the urinary bladder may be related to recent instrumentation or secondary to infectious etiology. Stomach/Bowel: There is moderate stool throughout the colon. No bowel obstruction or active inflammation. The appendix is normal. Vascular/Lymphatic: The abdominal aorta and IVC are unremarkable. No portal venous gas. There is no adenopathy. Reproductive: The uterus is anteverted.  No adnexal masses. Other: None Musculoskeletal: Degenerative changes of the lower lumbar spine. L5-S1 facet arthropathy. No acute osseous pathology. IMPRESSION: 1. Left-sided pyelonephritis. No drainable fluid collection or abscess. 2. A 3.0 x 2.6 cm enhancing lesion in the interpolar left kidney concerning for renal cell carcinoma. Further  characterization with renal mass protocol MRI on a nonemergent/outpatient basis recommended. 3. No bowel obstruction. Normal appendix. Electronically Signed   By: Elgie Collard M.D.   On: 08/11/2021 00:50        Scheduled Meds:  heparin  5,000 Units Subcutaneous Q8H   insulin aspart  0-15 Units Subcutaneous TID WC   insulin aspart  0-5 Units Subcutaneous QHS   insulin aspart  3 Units Subcutaneous TID WC   insulin glargine-yfgn  25 Units Subcutaneous QHS   loratadine  10 mg Oral Daily   mometasone-formoterol  2 puff Inhalation BID   pantoprazole (PROTONIX) IV  40 mg Intravenous Q24H   Continuous Infusions:  ceFEPime (MAXIPIME) IV 2 g (08/12/21 0524)     LOS: 1 day    Time spent: 35 minutes    Kyshawn Teal Hoover Brunette, DO [facility]  If 7PM-7AM, please contact night-coverage [email or web address] 08/12/2021, 9:50 AM

## 2021-08-12 NOTE — Progress Notes (Signed)
Initial Nutrition Assessment  DOCUMENTATION CODES:      INTERVENTION:  Ensure Enlive po BID, each supplement provides 350 kcal and 20 grams of protein   Request current weight  NUTRITION DIAGNOSIS:   Inadequate oral intake related to acute illness (UTI) as evidenced by meal completion < 50% ((average)).   GOAL:  Patient will meet greater than or equal to 90% of their needs  MONITOR:  PO intake, Supplement acceptance, Weight trends, Labs  REASON FOR ASSESSMENT:   Malnutrition Screening Tool    ASSESSMENT: Patient is a 50 yo female history of diabetes and asthma. Presents with abdominal pain, nausea and vomiting. Work up findings - UTI, left renal mass, hyponatremia.   Patient ate 0-25% of 4 meals and today at lunch tray observed by RD and intake has improved to 75% of meal. Able to feed herself. Usual body weight last recorded at 73 kg in July 2021. Need current weight. Estimated nutrition needs based on last known weight. Will adjust nutrition intake goal once current weight is obtained.   Medications: insulin, protonix. Drip- Maximpime  BMP Latest Ref Rng & Units 08/12/2021 08/10/2021 02/26/2020  Glucose 70 - 99 mg/dL 95 465(K) 354(S)  BUN 6 - 20 mg/dL 17 12 12   Creatinine 0.44 - 1.00 mg/dL 5.68 1.27  Sodium 135 - 145 mmol/L 134(L) 127(L) 133(L)  Potassium 3.5 - 5.1 mmol/L 3.8 3.9 4.4  Chloride 98 - 111 mmol/L 102 96(L) 96(L)  CO2 22 - 32 mmol/L 23 22 25   Calcium 8.9 - 10.3 mg/dL 8.2(L) 8.3(L) 10.1      NUTRITION - FOCUSED PHYSICAL EXAM:  Flowsheet Row Most Recent Value  Orbital Region No depletion  Upper Arm Region No depletion  Thoracic and Lumbar Region No depletion  Buccal Region No depletion  Temple Region Mild depletion  Clavicle Bone Region No depletion  Clavicle and Acromion Bone Region No depletion  Scapular Bone Region No depletion  Dorsal Hand No depletion  Patellar Region Mild depletion  Edema (RD Assessment) None  Hair Reviewed  Eyes  Reviewed  Mouth Reviewed  Skin Reviewed       Diet Order:   Diet Order             Diet heart healthy/carb modified Room service appropriate? Yes; Fluid consistency: Thin  Diet effective now                   EDUCATION NEEDS:  No education needs have been identified at this time  Skin:  Skin Assessment: Reviewed RN Assessment  Last BM:  11/22  Height:   Ht Readings from Last 1 Encounters:  03/19/20 5' (1.524 m)    Weight:   Wt Readings from Last 1 Encounters:  03/19/20 73 kg    Ideal Body Weight:   45 kg  BMI:  There is no height or weight on file to calculate BMI.  Estimated Nutritional Needs:   Kcal:  1600-1800  Protein:  80-88 gr  Fluid:  1.6-1.8 liters daily   05/20/20 MS,RD,CSG,LDN Contact: 05/20/20

## 2021-08-13 LAB — CBC
HCT: 36.9 % (ref 36.0–46.0)
Hemoglobin: 12.4 g/dL (ref 12.0–15.0)
MCH: 30.1 pg (ref 26.0–34.0)
MCHC: 33.6 g/dL (ref 30.0–36.0)
MCV: 89.6 fL (ref 80.0–100.0)
Platelets: 174 10*3/uL (ref 150–400)
RBC: 4.12 MIL/uL (ref 3.87–5.11)
RDW: 11.8 % (ref 11.5–15.5)
WBC: 8.4 10*3/uL (ref 4.0–10.5)
nRBC: 0 % (ref 0.0–0.2)

## 2021-08-13 LAB — BASIC METABOLIC PANEL
Anion gap: 7 (ref 5–15)
BUN: 12 mg/dL (ref 6–20)
CO2: 24 mmol/L (ref 22–32)
Calcium: 8.5 mg/dL — ABNORMAL LOW (ref 8.9–10.3)
Chloride: 102 mmol/L (ref 98–111)
Creatinine, Ser: 0.64 mg/dL (ref 0.44–1.00)
GFR, Estimated: >60 mL/min (ref >60)
Glucose, Bld: 168 mg/dL — ABNORMAL HIGH (ref 70–99)
Potassium: 4.2 mmol/L (ref 3.5–5.1)
Sodium: 133 mmol/L — ABNORMAL LOW (ref 135–145)

## 2021-08-13 LAB — GLUCOSE, CAPILLARY
Glucose-Capillary: 138 mg/dL — ABNORMAL HIGH (ref 70–99)
Glucose-Capillary: 247 mg/dL — ABNORMAL HIGH (ref 70–99)

## 2021-08-13 MED ORDER — LIDOCAINE VISCOUS HCL 2 % MT SOLN
15.0000 mL | Freq: Once | OROMUCOSAL | Status: AC
  Administered 2021-08-13: 15 mL via ORAL
  Filled 2021-08-13: qty 15

## 2021-08-13 MED ORDER — FOSFOMYCIN TROMETHAMINE 3 G PO PACK
3.0000 g | PACK | Freq: Once | ORAL | Status: AC
  Administered 2021-08-13: 3 g via ORAL
  Filled 2021-08-13: qty 3

## 2021-08-13 MED ORDER — CIPROFLOXACIN HCL 500 MG PO TABS: 500.0000 mg | ORAL_TABLET | Freq: Two times a day (BID) | ORAL | 0 refills | Status: AC

## 2021-08-13 MED ORDER — ALUM & MAG HYDROXIDE-SIMETH 200-200-20 MG/5ML PO SUSP
30.0000 mL | Freq: Once | ORAL | Status: AC
  Administered 2021-08-13: 30 mL via ORAL
  Filled 2021-08-13: qty 30

## 2021-08-13 NOTE — Progress Notes (Signed)
Patient discharged home today, transported by partner. Discharge summary went over with patient and partner, both verbalized understanding. Belongings sent home with patient. Patient stable at discharge.

## 2021-08-13 NOTE — TOC Transition Note (Signed)
Transition of Care [facility]) - CM/SW Discharge Note   Patient Details  Name: Margaret Santana MRN: [id number] Date of Birth: January 08, 1971  [facility] CM/SW Contact:  Barry Brunner, LCSW Phone Number: 08/13/2021, 3:23 PM   Clinical Narrative:    CSW notified of patient's need for PCP. CSW provide spanish [facility] form. CSW LVM for [facility] referral. TOC signing off.     Barriers to Discharge: Continued Medical Work up   Patient Goals and CMS Choice        Discharge Placement                       Discharge Plan and Services                                     Social Determinants of Health (SDOH) Interventions     Readmission Risk Interventions Readmission Risk Prevention Plan 01/30/2019  Post Dischage Appt Complete  Medication Screening Complete  Transportation Screening Complete  Some recent data might be hidden

## 2021-08-13 NOTE — Progress Notes (Signed)
Pt stated she feels pressure in abdomen along with pain and feels hot. Temp is 99.0 gave her Tylenol 650mg . She says at night she feels hungry but does not want to eat due to nausea, pain and she has eaten at each meal time during the day. I told her I could give her zofran for nausea. Patient refused, she says she feels pressure like indigestion. Notified provider.

## 2021-08-13 NOTE — Discharge Summary (Signed)
Physician Discharge Summary  Margaret Santana YBW:[id number] DOB: 20-Dec-1970 DOA: 08/10/2021  PCP: Pcp, No  Admit date: 08/10/2021  Discharge date: 08/13/2021  Admitted From:Home  Disposition:  Home  Recommendations for Outpatient Follow-up:  Follow up with PCP in 1-2 weeks, list will be provided Follow-up with urology as recommended with referral sent to evaluate and manage renal mass.  MRI recommended on a nonemergent basis Continue on ciprofloxacin as prescribed for 4 more days to complete 7-day course of treatment Continue other home medications as prior  Home Health: None  Equipment/Devices: None  Discharge Condition:Stable  CODE STATUS: Full  Diet recommendation: Heart Healthy/carb modified  Brief/Interim Summary:  Margaret Santana  is a 50 y.o. female, with history of asthma and diabetes mellitus who presents to the ED with a chief complaint of stomach pain and nausea and vomiting.  She has been admitted with UTI/left-sided pyelonephritis and is also noted to have a renal mass to her left side suspicious for renal cell carcinoma.  Her urine culture had returned with E. coli and she was initially started on Rocephin and eventually transition to cefepime on account of some persistent fevers.  She no longer has any clinical symptoms of pyelonephritis and is tolerating p.o. intake with no further nausea or vomiting.  She has had no further fevers or chills over the last 48 hours.  She will receive a dose of fosfomycin prior to discharge and will be sent home on ciprofloxacin for 4 more days to complete 7-day course of treatment.  She has been encouraged to follow-up with urology with referral sent to evaluate the renal mass that is present.  No other acute events noted throughout the course of the stay.  Discharge Diagnoses:  Principal Problem:   Pyelonephritis Active Problems:   Asthma   Type 2 diabetes mellitus (HCC)   SIRS (systemic inflammatory response  syndrome) (HCC)   Acute pyelonephritis  Principal discharge diagnosis: E. coli UTI with left-sided pyelonephritis.  Discharge Instructions  Discharge Instructions     Ambulatory referral to Urology   Complete by: As directed    Diet - low sodium heart healthy   Complete by: As directed    Increase activity slowly   Complete by: As directed       Allergies as of 08/13/2021   No Known Allergies      Medication List     STOP taking these medications    predniSONE 10 MG tablet Commonly known as: DELTASONE       TAKE these medications    acetaminophen 325 MG tablet Commonly known as: TYLENOL Take 2 tablets (650 mg total) by mouth every 6 (six) hours as needed for mild pain (or Fever >/= 101).   albuterol 108 (90 Base) MCG/ACT inhaler Commonly known as: VENTOLIN HFA Inhale 1-2 puffs into the lungs every 6 (six) hours as needed for wheezing or shortness of breath.   budesonide-formoterol 160-4.5 MCG/ACT inhaler Commonly known as: Symbicort Inhale 2 puffs into the lungs 2 (two) times daily.   chlorpheniramine-HYDROcodone 10-8 MG/5ML Suer Commonly known as: TUSSIONEX Take 5 mLs by mouth every 12 (twelve) hours.   ciprofloxacin 500 MG tablet Commonly known as: Cipro Take 1 tablet (500 mg total) by mouth 2 (two) times daily for 4 days.   dextromethorphan-guaiFENesin 30-600 MG 12hr tablet Commonly known as: MUCINEX DM Take 1 tablet by mouth 2 (two) times daily.   insulin aspart protamine- aspart (70-30) 100 UNIT/ML injection Commonly known as: NOVOLOG MIX 70/30 Inject  0.55 mLs (55 Units total) into the skin 2 (two) times daily with a meal.   loratadine 10 MG tablet Commonly known as: CLARITIN Take 10 mg by mouth daily.   metFORMIN 500 MG tablet Commonly known as: GLUCOPHAGE Take 1 tablet (500 mg total) by mouth 2 (two) times daily with a meal.   montelukast 10 MG tablet Commonly known as: SINGULAIR Take 1 tablet (10 mg total) by mouth daily.   naproxen  500 MG tablet Commonly known as: NAPROSYN Take 1 tablet (500 mg total) by mouth 2 (two) times daily as needed.   omeprazole 20 MG capsule Commonly known as: PRILOSEC Take 1 capsule (20 mg total) by mouth daily.   THEOPHYLLINE CR PO Take 10 mg by mouth daily.        Follow-up Information     pcp. Schedule an appointment as soon as possible for a visit in 1 week(s).          [facility]. Go in 1 week(s).   Contact information: [address] [place] [address] [phone]               No Known Allergies  Consultations: None   Procedures/Studies: DG Abd 1 View  Result Date: 08/11/2021 CLINICAL DATA:  Abdominal pain EXAM: ABDOMEN - 1 VIEW COMPARISON:  08/11/2021 FINDINGS: The bowel gas pattern is normal. No radio-opaque calculi or other significant radiographic abnormality are seen. IMPRESSION: Negative. Electronically Signed   By: Duanne Guess D.O.   On: 08/11/2021 13:59   CT ABDOMEN PELVIS W CONTRAST  Result Date: 08/11/2021 CLINICAL DATA:  Abdominal pain. EXAM: CT ABDOMEN AND PELVIS WITH CONTRAST TECHNIQUE: Multidetector CT imaging of the abdomen and pelvis was performed using the standard protocol following bolus administration of intravenous contrast. CONTRAST:  OMNIPAQUE IOHEXOL 300 MG/ML  SOLN COMPARISON:  CT abdomen pelvis dated 02/27/2020. FINDINGS: Lower chest: The visualized lung bases are clear. No intra-abdominal free air or free fluid. Hepatobiliary: Probable mild fatty liver. No intrahepatic biliary dilatation. Probable small stone within the gallbladder. No pericholecystic fluid or evidence of acute cholecystitis by CT. Pancreas: Unremarkable. No pancreatic ductal dilatation or surrounding inflammatory changes. Spleen: Normal in size without focal abnormality. Adrenals/Urinary Tract: The adrenal glands are unremarkable. There is a 3.0 x 2.6 cm enhancing lesion in the interpolar left kidney  as seen on the prior CT. Findings concerning for renal cell carcinoma. Further characterization with renal mass protocol MRI on a nonemergent/outpatient basis recommended. Areas of heterogeneous enhancement in the upper pole and lower pole of the left kidney most concerning for pyelonephritis. No drainable fluid collection or abscess. Subcentimeter bilateral renal hypodense lesions are not characterized. The visualized ureters and urinary bladder appear unremarkable. Air within the urinary bladder may be related to recent instrumentation or secondary to infectious etiology. Stomach/Bowel: There is moderate stool throughout the colon. No bowel obstruction or active inflammation. The appendix is normal. Vascular/Lymphatic: The abdominal aorta and IVC are unremarkable. No portal venous gas. There is no adenopathy. Reproductive: The uterus is anteverted.  No adnexal masses. Other: None Musculoskeletal: Degenerative changes of the lower lumbar spine. L5-S1 facet arthropathy. No acute osseous pathology. IMPRESSION: 1. Left-sided pyelonephritis. No drainable fluid collection or abscess. 2. A 3.0 x 2.6 cm enhancing lesion in the interpolar left kidney concerning for renal cell carcinoma. Further characterization with renal mass protocol MRI on a nonemergent/outpatient basis recommended. 3. No bowel obstruction. Normal appendix. Electronically Signed   By: Elgie Collard  M.D.   On: 08/11/2021 00:50     Discharge Exam: Vitals:   08/13/21 0532 08/13/21 1433  BP: 115/71 129/74  Pulse: 76 93  Resp: 18 16  Temp: 98.1 F (36.7 C) 98 F (36.7 C)  SpO2: 96% 97%   Vitals:   08/12/21 2100 08/13/21 0152 08/13/21 0532 08/13/21 1433  BP: (!) 168/95  115/71 129/74  Pulse: (!) 106  76 93  Resp: Temp: 98.9 F (37.2 C) 99 F (37.2 C) 98.1 F (36.7 C) 98 F (36.7 C)  TempSrc: Oral Oral Oral Oral  SpO2: 99%  96% 97%    General: Pt is alert, awake, not in acute distress Cardiovascular: RRR, S1/S2 +, no  rubs, no gallops Respiratory: CTA bilaterally, no wheezing, no rhonchi Abdominal: Soft, NT, ND, bowel sounds + Extremities: no edema, no cyanosis    The results of significant diagnostics from this hospitalization (including imaging, microbiology, ancillary and laboratory) are listed below for reference.     Microbiology: Recent Results (from the past 240 hour(s))  Resp Panel by RT-PCR (Flu A&B, Covid) Nasopharyngeal Swab     Status: None   Collection Time: 08/10/21  9:37 PM   Specimen: Nasopharyngeal Swab; Nasopharyngeal(NP) swabs in vial transport medium  Result Value Ref Range Status   SARS Coronavirus 2 by RT PCR NEGATIVE NEGATIVE Final    Comment: (NOTE) SARS-CoV-2 target nucleic acids are NOT DETECTED.  The SARS-CoV-2 RNA is generally detectable in upper respiratory specimens during the acute phase of infection. The lowest concentration of SARS-CoV-2 viral copies this assay can detect is 138 copies/mL. A negative result does not preclude SARS-Cov-2 infection and should not be used as the sole basis for treatment or other patient management decisions. A negative result may occur with  improper specimen collection/handling, submission of specimen other than nasopharyngeal swab, presence of viral mutation(s) within the areas targeted by this assay, and inadequate number of viral copies(<138 copies/mL). A negative result must be combined with clinical observations, patient history, and epidemiological information. The expected result is Negative.  Fact Sheet for Patients:  BloggerCourse.com  Fact Sheet for Healthcare Providers:  SeriousBroker.it  This test is no t yet approved or cleared by the [place] FDA and  has been authorized for detection and/or diagnosis of SARS-CoV-2 by FDA under an Emergency Use Authorization (EUA). This EUA will remain  in effect (meaning this test can be used) for the duration of  the COVID-19 declaration under Section 564(b)(1) of the Act, 21 U.S.C.section 360bbb-3(b)(1), unless the authorization is terminated  or revoked sooner.       Influenza A by PCR NEGATIVE NEGATIVE Final   Influenza B by PCR NEGATIVE NEGATIVE Final    Comment: (NOTE) The Xpert Xpress SARS-CoV-2/FLU/RSV plus assay is intended as an aid in the diagnosis of influenza from Nasopharyngeal swab specimens and should not be used as a sole basis for treatment. Nasal washings and aspirates are unacceptable for Xpert Xpress SARS-CoV-2/FLU/RSV testing.  Fact Sheet for Patients: BloggerCourse.com  Fact Sheet for Healthcare Providers: SeriousBroker.it  This test is not yet approved or cleared by the [place] FDA and has been authorized for detection and/or diagnosis of SARS-CoV-2 by FDA under an Emergency Use Authorization (EUA). This EUA will remain in effect (meaning this test can be used) for the duration of the COVID-19 declaration under Section 564(b)(1) of the Act, 21 U.S.C. section 360bbb-3(b)(1), unless the authorization is terminated or revoked.  Performed at [facility], [phone]  [address] [place], [place] [address]   Urine Culture     Status: Abnormal (Preliminary result)   Collection Time: 08/11/21  2:04 AM   Specimen: Urine, Clean Catch  Result Value Ref Range Status   Specimen Description   Final    URINE, CLEAN CATCH Performed at [facility], [address] [place], [place] [address]    Special Requests   Final    NONE Performed at [facility], [address] [place], [place] [address]    Culture (A)  Final    >=100,000 COLONIES/mL ESCHERICHIA COLI SUSCEPTIBILITIES TO FOLLOW Performed at [facility], [address] [place], [place] [address]    Report Status PENDING  Incomplete  Culture, blood (routine x 2)     Status: None (Preliminary result)   Collection Time: 08/11/21 10:07 AM   Specimen: BLOOD RIGHT  HAND  Result Value Ref Range Status   Specimen Description BLOOD RIGHT HAND  Final   Special Requests   Final    BOTTLES DRAWN AEROBIC AND ANAEROBIC Blood Culture results may not be optimal due to an inadequate volume of blood received in culture bottles   Culture   Final    NO GROWTH 2 DAYS Performed at [facility], [address] [place], [place] [address]    Report Status PENDING  Incomplete  Culture, blood (routine x 2)     Status: None (Preliminary result)   Collection Time: 08/11/21 10:07 AM   Specimen: Left Antecubital; Blood  Result Value Ref Range Status   Specimen Description LEFT ANTECUBITAL  Final   Special Requests   Final    BOTTLES DRAWN AEROBIC AND ANAEROBIC Blood Culture adequate volume   Culture   Final    NO GROWTH 2 DAYS Performed at [facility], [address], [place], [place] [address]    Report Status PENDING  Incomplete     Labs: BNP (last 3 results) No results for input(s): BNP in the last 8760 hours. Basic Metabolic Panel: Recent Labs  Lab 08/10/21 2127 08/12/21 0437 08/13/21 0447  NA 127* 134* 133*  K 3.9 3.8 4.2  CL 96* 102 102  CO2 22 23 24   GLUCOSE 298* 95 168*  BUN 12 17 12   CREATININE 0.73 0.68 0.64  CALCIUM 8.3* 8.2* 8.5*  MG  --  1.8  1.8  --    Liver Function Tests: Recent Labs  Lab 08/10/21 2127 08/12/21 0437  AST 11* 12*  ALT 11 12  ALKPHOS 47 47  BILITOT 1.0 0.6  PROT 6.8 6.2*  ALBUMIN 3.3* 2.6*   Recent Labs  Lab 08/10/21 2127  LIPASE 29   No results for input(s): AMMONIA in the last 168 hours. CBC: Recent Labs  Lab 08/10/21 2127 08/12/21 0437 08/13/21 0447  WBC 17.8* 10.5 8.4  NEUTROABS 15.2* 8.1*  --   HGB 13.6 12.8 12.4  HCT 39.6 38.1 36.9  MCV 90.4 90.1 89.6  PLT 168 147* 174   Cardiac Enzymes: No results for input(s): CKTOTAL, CKMB, CKMBINDEX, TROPONINI in the last 168 hours. BNP: Invalid input(s): POCBNP CBG: Recent Labs  Lab 08/12/21 1103 08/12/21 1613 08/12/21 2142 08/13/21 0736  08/13/21 1206  GLUCAP 155* 271* 166* 138* 247*   D-Dimer No results for input(s): DDIMER in the last 72 hours. Hgb A1c Recent Labs    08/11/21 0604  HGBA1C 11.8*   Lipid Profile No results for input(s): CHOL, HDL, LDLCALC, TRIG, CHOLHDL, LDLDIRECT in the last 72 hours. Thyroid function studies No  results for input(s): TSH, T4TOTAL, T3FREE, THYROIDAB in the last 72 hours.  Invalid input(s): FREET3 Anemia work up No results for input(s): VITAMINB12, FOLATE, FERRITIN, TIBC, IRON, RETICCTPCT in the last 72 hours. Urinalysis    Component Value Date/Time   COLORURINE YELLOW 08/11/2021 0204   APPEARANCEUR HAZY (A) 08/11/2021 0204   LABSPEC 1.015 08/11/2021 0204   PHURINE 6.0 08/11/2021 0204   GLUCOSEU >=500 (A) 08/11/2021 0204   HGBUR SMALL (A) 08/11/2021 0204   BILIRUBINUR NEGATIVE 08/11/2021 0204   KETONESUR 15 (A) 08/11/2021 0204   PROTEINUR 100 (A) 08/11/2021 0204   NITRITE NEGATIVE 08/11/2021 0204   LEUKOCYTESUR NEGATIVE 08/11/2021 0204   Sepsis Labs Invalid input(s): PROCALCITONIN,  WBC,  LACTICIDVEN Microbiology Recent Results (from the past 240 hour(s))  Resp Panel by RT-PCR (Flu A&B, Covid) Nasopharyngeal Swab     Status: None   Collection Time: 08/10/21  9:37 PM   Specimen: Nasopharyngeal Swab; Nasopharyngeal(NP) swabs in vial transport medium  Result Value Ref Range Status   SARS Coronavirus 2 by RT PCR NEGATIVE NEGATIVE Final    Comment: (NOTE) SARS-CoV-2 target nucleic acids are NOT DETECTED.  The SARS-CoV-2 RNA is generally detectable in upper respiratory specimens during the acute phase of infection. The lowest concentration of SARS-CoV-2 viral copies this assay can detect is 138 copies/mL. A negative result does not preclude SARS-Cov-2 infection and should not be used as the sole basis for treatment or other patient management decisions. A negative result may occur with  improper specimen collection/handling, submission of specimen other than  nasopharyngeal swab, presence of viral mutation(s) within the areas targeted by this assay, and inadequate number of viral copies(<138 copies/mL). A negative result must be combined with clinical observations, patient history, and epidemiological information. The expected result is Negative.  Fact Sheet for Patients:  BloggerCourse.com  Fact Sheet for Healthcare Providers:  SeriousBroker.it  This test is no t yet approved or cleared by the [place] FDA and  has been authorized for detection and/or diagnosis of SARS-CoV-2 by FDA under an Emergency Use Authorization (EUA). This EUA will remain  in effect (meaning this test can be used) for the duration of the COVID-19 declaration under Section 564(b)(1) of the Act, 21 U.S.C.section 360bbb-3(b)(1), unless the authorization is terminated  or revoked sooner.       Influenza A by PCR NEGATIVE NEGATIVE Final   Influenza B by PCR NEGATIVE NEGATIVE Final    Comment: (NOTE) The Xpert Xpress SARS-CoV-2/FLU/RSV plus assay is intended as an aid in the diagnosis of influenza from Nasopharyngeal swab specimens and should not be used as a sole basis for treatment. Nasal washings and aspirates are unacceptable for Xpert Xpress SARS-CoV-2/FLU/RSV testing.  Fact Sheet for Patients: BloggerCourse.com  Fact Sheet for Healthcare Providers: SeriousBroker.it  This test is not yet approved or cleared by the [place] FDA and has been authorized for detection and/or diagnosis of SARS-CoV-2 by FDA under an Emergency Use Authorization (EUA). This EUA will remain in effect (meaning this test can be used) for the duration of the COVID-19 declaration under Section 564(b)(1) of the Act, 21 U.S.C. section 360bbb-3(b)(1), unless the authorization is terminated or revoked.  Performed at [facility], [address] [place], [place] [address]   Urine  Culture     Status: Abnormal (Preliminary result)   Collection Time: 08/11/21  2:04 AM   Specimen: Urine, Clean Catch  Result Value Ref Range Status   Specimen Description   Final    URINE, CLEAN CATCH  Performed at [facility], [address], [place], [place] [address]    Special Requests   Final    NONE Performed at [facility], [address], [place], [place] [address]    Culture (A)  Final    >=100,000 COLONIES/mL ESCHERICHIA COLI SUSCEPTIBILITIES TO FOLLOW Performed at [facility], [address] [place], [place] [address]    Report Status PENDING  Incomplete  Culture, blood (routine x 2)     Status: None (Preliminary result)   Collection Time: 08/11/21 10:07 AM   Specimen: BLOOD RIGHT HAND  Result Value Ref Range Status   Specimen Description BLOOD RIGHT HAND  Final   Special Requests   Final    BOTTLES DRAWN AEROBIC AND ANAEROBIC Blood Culture results may not be optimal due to an inadequate volume of blood received in culture bottles   Culture   Final    NO GROWTH 2 DAYS Performed at [facility], [address], [place], [place] [address]    Report Status PENDING  Incomplete  Culture, blood (routine x 2)     Status: None (Preliminary result)   Collection Time: 08/11/21 10:07 AM   Specimen: Left Antecubital; Blood  Result Value Ref Range Status   Specimen Description LEFT ANTECUBITAL  Final   Special Requests   Final    BOTTLES DRAWN AEROBIC AND ANAEROBIC Blood Culture adequate volume   Culture   Final    NO GROWTH 2 DAYS Performed at [facility], [address] [place], [place] [address]    Report Status PENDING  Incomplete     Time coordinating discharge: 35 minutes  SIGNED:   Erick Blinks, DO [facility] 08/13/2021, 3:12 PM  If 7PM-7AM, please contact night-coverage [email or web address]

## 2021-08-14 LAB — URINE CULTURE: Culture: >=100000 — AB

## 2021-08-16 ENCOUNTER — Encounter: Payer: Self-pay | Admitting: *Deleted

## 2021-08-16 LAB — CULTURE, BLOOD (ROUTINE X 2)
Culture: NO GROWTH
Culture: NO GROWTH
Special Requests: ADEQUATE

## 2021-09-09 ENCOUNTER — Encounter (HOSPITAL_COMMUNITY): Payer: Self-pay | Admitting: Radiology
